# Patient Record
Sex: Female | Born: 1975 | Race: White | Hispanic: No | Marital: Married | Smoking: Current every day smoker
Health system: Southern US, Community
[De-identification: ages and names within clinical notes are randomized; demographics above are authoritative.]

## PROBLEM LIST (undated history)

## (undated) DIAGNOSIS — F419 Anxiety disorder, unspecified: Secondary | ICD-10-CM

## (undated) DIAGNOSIS — K219 Gastro-esophageal reflux disease without esophagitis: Secondary | ICD-10-CM

## (undated) DIAGNOSIS — F32A Depression, unspecified: Secondary | ICD-10-CM

## (undated) DIAGNOSIS — M1990 Unspecified osteoarthritis, unspecified site: Secondary | ICD-10-CM

## (undated) HISTORY — DX: Anxiety disorder, unspecified: F41.9

## (undated) HISTORY — DX: Unspecified osteoarthritis, unspecified site: M19.90

## (undated) HISTORY — PX: WISDOM TOOTH EXTRACTION: SHX21

## (undated) HISTORY — DX: Gastro-esophageal reflux disease without esophagitis: K21.9

## (undated) HISTORY — DX: Depression, unspecified: F32.A

---

## 2013-12-23 ENCOUNTER — Emergency Department: Payer: Self-pay | Admitting: Emergency Medicine

## 2014-06-19 ENCOUNTER — Emergency Department: Payer: Self-pay | Admitting: Emergency Medicine

## 2017-02-16 ENCOUNTER — Emergency Department: Payer: Self-pay

## 2017-02-16 ENCOUNTER — Emergency Department
Admission: EM | Admit: 2017-02-16 | Discharge: 2017-02-16 | Disposition: A | Discharge status: Home / Self Care | Payer: Self-pay | Attending: Emergency Medicine | Admitting: Emergency Medicine

## 2017-02-16 ENCOUNTER — Encounter: Payer: Self-pay | Admitting: Emergency Medicine

## 2017-02-16 ENCOUNTER — Telehealth: Payer: Self-pay | Admitting: Family

## 2017-02-16 DIAGNOSIS — F17200 Nicotine dependence, unspecified, uncomplicated: Secondary | ICD-10-CM | POA: Insufficient documentation

## 2017-02-16 DIAGNOSIS — R079 Chest pain, unspecified: Secondary | ICD-10-CM

## 2017-02-16 DIAGNOSIS — R1010 Upper abdominal pain, unspecified: Secondary | ICD-10-CM

## 2017-02-16 DIAGNOSIS — R1011 Right upper quadrant pain: Secondary | ICD-10-CM | POA: Insufficient documentation

## 2017-02-16 LAB — BASIC METABOLIC PANEL
ANION GAP: 7 (ref 5–15)
BUN: 16 mg/dL (ref 6–20)
CALCIUM: 9.2 mg/dL (ref 8.9–10.3)
CHLORIDE: 106 mmol/L (ref 101–111)
CO2: 24 mmol/L (ref 22–32)
CREATININE: 0.83 mg/dL (ref 0.44–1.00)
GFR calc Af Amer: >60 mL/min (ref >60)
GFR calc non Af Amer: >60 mL/min (ref >60)
Glucose, Bld: 94 mg/dL (ref 65–99)
Potassium: 3.5 mmol/L (ref 3.5–5.1)
SODIUM: 137 mmol/L (ref 135–145)

## 2017-02-16 LAB — CBC
HCT: 40.8 % (ref 35.0–47.0)
Hemoglobin: 14.2 g/dL (ref 12.0–16.0)
MCH: 33 pg (ref 26.0–34.0)
MCHC: 34.8 g/dL (ref 32.0–36.0)
MCV: 95 fL (ref 80.0–100.0)
PLATELETS: 281 10*3/uL (ref 150–440)
RBC: 4.3 MIL/uL (ref 3.80–5.20)
RDW: 13.3 % (ref 11.5–14.5)
WBC: 13.6 10*3/uL — AB (ref 3.6–11.0)

## 2017-02-16 LAB — HEPATIC FUNCTION PANEL
ALBUMIN: 4.3 g/dL (ref 3.5–5.0)
ALT: 27 U/L (ref 14–54)
AST: 21 U/L (ref 15–41)
Alkaline Phosphatase: 75 U/L (ref 38–126)
Bilirubin, Direct: <0.1 mg/dL — ABNORMAL LOW (ref 0.1–0.5)
Total Bilirubin: 0.7 mg/dL (ref 0.3–1.2)
Total Protein: 7.1 g/dL (ref 6.5–8.1)

## 2017-02-16 LAB — TROPONIN I

## 2017-02-16 LAB — LIPASE, BLOOD: Lipase: 30 U/L (ref 11–51)

## 2017-02-16 MED ORDER — PANTOPRAZOLE SODIUM 40 MG PO TBEC: 40.0000 mg | DELAYED_RELEASE_TABLET | Freq: Every day | ORAL | 1 refills | Status: DC

## 2017-02-16 MED ORDER — GI COCKTAIL ~~LOC~~
ORAL | Status: AC
  Administered 2017-02-16: 30 mL via ORAL
  Filled 2017-02-16: qty 30

## 2017-02-16 MED ORDER — GI COCKTAIL ~~LOC~~
30.0000 mL | Freq: Once | ORAL | Status: AC
  Administered 2017-02-16: 30 mL via ORAL

## 2017-02-16 NOTE — ED Triage Notes (Signed)
Pt ambulatory to triage with steady gait, no distress noted. Pt c/o central chest pain that radiates into the right side, no accompanied symptoms of N/V, SOB. Pt reports chest pain started this AM and has been intermittent throughout the day without relief.

## 2017-02-16 NOTE — ED Notes (Signed)
Pt returned from US

## 2017-02-16 NOTE — Progress Notes (Signed)
Based on what you shared with me it looks like you have a serious condition that should be evaluated in a face to face office visit.  NOTE: Even if you have entered your credit card information for this eVisit, you will not be charged.   If you are having a true medical emergency please call 911.  If you need an urgent face to face visit, [facility] has four urgent care centers for your convenience.  If you need care fast and have a high deductible or no insurance consider:   [email or web address]  [phone]  [address], [address] [place], [place] [address] 8 am to 8 pm Monday-Friday 10 am to 4 pm Saturday-Sunday   The following sites will take your  insurance:    . [facility]  [phone] Get Driving Directions Find a Provider at this Location  [address] [place], [place] [address] . 10 am to 8 pm Monday-Friday . 12 pm to 8 pm Saturday-Sunday   . [facility]  [phone] Get Driving Directions Find a Provider at this Location  [address], [address] [place], [place] [address] . 8 am to 8 pm Monday-Friday . 9 am to 6 pm Saturday . 11 am to 6 pm Sunday   .  [facility] [place]  [phone] Get Driving Directions  [address] [place], [place] [address] . 8 am to 8 pm Monday-Friday . 8 am to 4 pm Saturday-Sunday   Your e-visit answers were reviewed by a board certified advanced clinical practitioner to complete your personal care plan.  Thank you for using e-Visits.  

## 2017-02-16 NOTE — Discharge Instructions (Signed)
As we discussed please take your Protonix every morning as prescribed. Please use over-the-counter liquid Maaloxup to 3 times daily for abdominal discomfort. If your abdominal pain fails to resolve within the next 3-4 days please call the number provided for GI medicine to arrange a follow-up appointment as soon as possible for further evaluation. Return to the emergency department for any worsening pain, fever, or any other symptom personally concerning to yourself.

## 2017-02-16 NOTE — ED Notes (Signed)
Called lab to check on add-on labs. They stated they know about them but haven't started running the tests yet.

## 2017-02-16 NOTE — ED Provider Notes (Signed)
[facility] Emergency Department Provider Note  Time seen: 9:47 PM  I have reviewed the triage vital signs and the nursing notes.   HISTORY  Chief Complaint Chest Pain    HPI Samantha Williams is a 41 y.o. female with no past medical history who presents the emergency department for right upper quadrant epigastric abdominal pain. According to the patient beginning this morning she developed epigastric and right upper quadrant abdominal pain. Patient states minimal nausea, no vomiting, no diarrhea, no dysuria or hematuria. No history of prior pain or abdominal surgeries. Describes her pain as moderate aching pain in the epigastrium radiating to the right upper quadrant.  History reviewed. No pertinent past medical history.  There are no active problems to display for this patient.   History reviewed. No pertinent surgical history.  Prior to Admission medications   Not on File    No Known Allergies  History reviewed. No pertinent family history.  Social History Social History  Substance Use Topics  . Smoking status: Current Every Day Smoker  . Smokeless tobacco: Never Used  . Alcohol use No    Review of Systems Constitutional: Negative for fever. Cardiovascular: Negative for chest pain. Respiratory: Negative for shortness of breath. Gastrointestinal: Right upper quadrant/epigastric abdominal pain. Negative for vomiting or diarrhea Genitourinary: Negative for dysuria. Musculoskeletal: Negative for back pain. Skin: Negative for rash. Neurological: Negative for headache All other ROS negative  ____________________________________________   PHYSICAL EXAM:  VITAL SIGNS: ED Triage Vitals  Enc Vitals Group     BP 02/16/17 2122 126/81     Pulse Rate 02/16/17 2122 87     Resp 02/16/17 2122 16     Temp 02/16/17 2122 98 F (36.7 C)     Temp Source 02/16/17 2122 Oral     SpO2 02/16/17 2122 100 %     Weight 02/16/17 2119 130 lb (59 kg)   Height 02/16/17 2119 5\' 1"  (1.549 m)     Head Circumference --      Peak Flow --      Pain Score --      Pain Loc --      Pain Edu? --      Excl. in GC? --     Constitutional: Alert and oriented. Well appearing and in no distress. Eyes: Normal exam ENT   Head: Normocephalic and atraumatic.   Mouth/Throat: Mucous membranes are moist. Cardiovascular: Normal rate, regular rhythm. No murmur Respiratory: Normal respiratory effort without tachypnea nor retractions. Breath sounds are clear  Gastrointestinal: Soft, moderate epigastric to right upper quadrant tenderness. No rebound or guarding. No distention. Musculoskeletal: Nontender with normal range of motion in all extremities Neurologic:  Normal speech and language. No gross focal neurologic deficits  Skin:  Skin is warm, dry and intact.  Psychiatric: Mood and affect are normal.  ____________________________________________    EKG  EKG reviewed and interpreted by myself shows normal sinus rhythm at 86 bpm, narrow QRS, normal axis, normal intervals, no ST changes. Normal EKG  ____________________________________________    RADIOLOGY  Ultrasound negative.  ____________________________________________   INITIAL IMPRESSION / ASSESSMENT AND PLAN / ED COURSE  Pertinent labs & imaging results that were available during my care of the patient were reviewed by me and considered in my medical decision making (see chart for details).  Patient presents the emergency department for upper abdominal discomfort and right upper quadrant pain beginning this morning. Describes her discomfort as moderate. Patient doesn't mild to moderate tenderness palpation epigastrium  and right upper quadrant. We will check labs, ultrasound of the right upper quadrant. Patient does not wish for anything for pain or nausea at this time. We will continue to closely monitor.  Ultrasound gallbladder is negative. Labs are normal including troponin, lipase  and LFTs. Given the patient's epigastric discomfort we will treat with a GI cocktail. We'll discharge with Protonix and over-the-counter Maalox. Patient is agreeable to this plan. ____________________________________________   FINAL CLINICAL IMPRESSION(S) / ED DIAGNOSES  Right upper quadrant pain    Minna Antis, MD 02/16/17 2243

## 2020-02-28 ENCOUNTER — Other Ambulatory Visit: Payer: Self-pay

## 2020-02-28 ENCOUNTER — Encounter: Payer: Self-pay | Admitting: Emergency Medicine

## 2020-02-28 ENCOUNTER — Emergency Department
Admission: EM | Admit: 2020-02-28 | Discharge: 2020-02-28 | Disposition: A | Discharge status: Home / Self Care | Payer: 59 | Attending: Emergency Medicine | Admitting: Emergency Medicine

## 2020-02-28 DIAGNOSIS — R197 Diarrhea, unspecified: Secondary | ICD-10-CM | POA: Diagnosis not present

## 2020-02-28 DIAGNOSIS — K219 Gastro-esophageal reflux disease without esophagitis: Secondary | ICD-10-CM

## 2020-02-28 DIAGNOSIS — R1084 Generalized abdominal pain: Secondary | ICD-10-CM | POA: Diagnosis present

## 2020-02-28 LAB — URINALYSIS, COMPLETE (UACMP) WITH MICROSCOPIC
Bacteria, UA: NONE SEEN
Bilirubin Urine: NEGATIVE
Glucose, UA: NEGATIVE mg/dL
Ketones, ur: NEGATIVE mg/dL
Leukocytes,Ua: NEGATIVE
Nitrite: NEGATIVE
Protein, ur: NEGATIVE mg/dL
Specific Gravity, Urine: 1.009 (ref 1.005–1.030)
pH: 6 (ref 5.0–8.0)

## 2020-02-28 LAB — COMPREHENSIVE METABOLIC PANEL
ALT: 17 U/L (ref 0–44)
AST: 16 U/L (ref 15–41)
Albumin: 4.1 g/dL (ref 3.5–5.0)
Alkaline Phosphatase: 88 U/L (ref 38–126)
Anion gap: 8 (ref 5–15)
BUN: 20 mg/dL (ref 6–20)
CO2: 24 mmol/L (ref 22–32)
Calcium: 9 mg/dL (ref 8.9–10.3)
Chloride: 106 mmol/L (ref 98–111)
Creatinine, Ser: 0.84 mg/dL (ref 0.44–1.00)
GFR calc Af Amer: >60 mL/min (ref >60)
GFR calc non Af Amer: >60 mL/min (ref >60)
Glucose, Bld: 93 mg/dL (ref 70–99)
Potassium: 3.9 mmol/L (ref 3.5–5.1)
Sodium: 138 mmol/L (ref 135–145)
Total Bilirubin: 0.6 mg/dL (ref 0.3–1.2)
Total Protein: 6.9 g/dL (ref 6.5–8.1)

## 2020-02-28 LAB — CBC
HCT: 39.9 % (ref 36.0–46.0)
Hemoglobin: 14.6 g/dL (ref 12.0–15.0)
MCH: 33.3 pg (ref 26.0–34.0)
MCHC: 36.6 g/dL — ABNORMAL HIGH (ref 30.0–36.0)
MCV: 91.1 fL (ref 80.0–100.0)
Platelets: 265 10*3/uL (ref 150–400)
RBC: 4.38 MIL/uL (ref 3.87–5.11)
RDW: 12.6 % (ref 11.5–15.5)
WBC: 11.4 10*3/uL — ABNORMAL HIGH (ref 4.0–10.5)
nRBC: 0 % (ref 0.0–0.2)

## 2020-02-28 LAB — LIPASE, BLOOD: Lipase: 36 U/L (ref 11–51)

## 2020-02-28 MED ORDER — DICYCLOMINE HCL 20 MG PO TABS: 20.0000 mg | ORAL_TABLET | Freq: Three times a day (TID) | ORAL | 0 refills | Status: DC | PRN

## 2020-02-28 MED ORDER — DICYCLOMINE HCL 20 MG PO TABS
20.0000 mg | ORAL_TABLET | Freq: Once | ORAL | Status: AC
  Administered 2020-02-28: 21:00:00 20 mg via ORAL
  Filled 2020-02-28: qty 1

## 2020-02-28 MED ORDER — PANTOPRAZOLE SODIUM 40 MG PO TBEC
40.0000 mg | DELAYED_RELEASE_TABLET | Freq: Once | ORAL | Status: AC
  Administered 2020-02-28: 40 mg via ORAL
  Filled 2020-02-28: qty 1

## 2020-02-28 MED ORDER — PANTOPRAZOLE SODIUM 40 MG PO TBEC: 40.0000 mg | DELAYED_RELEASE_TABLET | Freq: Every day | ORAL | 1 refills | Status: DC

## 2020-02-28 NOTE — ED Provider Notes (Signed)
ER Provider Note       Time seen: 9:11 PM    I have reviewed the vital signs and the nursing notes.  HISTORY   Chief Complaint Abdominal Pain    HPI Samantha Williams is a 44 y.o. female with no known past medical history who presents today for generalized abdominal pain with bloating and diarrhea for the past several days.  Patient denies any vomiting.  Patient states this is happened on and off in her life, was seen several years ago for this and was placed on Protonix.  She does take Tums for GI upset.  She is wondering if she has IBS.  Patient did not have any diarrhea today.  History reviewed. No pertinent past medical history.  History reviewed. No pertinent surgical history.  Allergies Patient has no known allergies.  Review of Systems Constitutional: Negative for fever. Cardiovascular: Negative for chest pain. Respiratory: Negative for shortness of breath. Gastrointestinal: Positive for abdominal pain, bloating and diarrhea. Musculoskeletal: Negative for back pain. Skin: Negative for rash. Neurological: Negative for headaches, focal weakness or numbness.  All systems negative/normal/unremarkable except as stated in the HPI  ____________________________________________   PHYSICAL EXAM:  VITAL SIGNS: Vitals:   02/28/20 1726  BP: 115/86  Pulse: 84  Resp: 16  Temp: 98.2 F (36.8 C)  SpO2: 100%    Constitutional: Alert and oriented. Well appearing and in no distress. Eyes: Conjunctivae are normal. Normal extraocular movements. ENT      Head: Normocephalic and atraumatic.      Nose: No congestion/rhinnorhea.      Mouth/Throat: Mucous membranes are moist.      Neck: No stridor. Cardiovascular: Normal rate, regular rhythm. No murmurs, rubs, or gallops. Respiratory: Normal respiratory effort without tachypnea nor retractions. Breath sounds are clear and equal bilaterally. No wheezes/rales/rhonchi. Gastrointestinal: Mild epigastric tenderness, no rebound  or guarding.  Normal bowel sounds. Musculoskeletal: Nontender with normal range of motion in extremities. No lower extremity tenderness nor edema. Neurologic:  Normal speech and language. No gross focal neurologic deficits are appreciated.  Skin:  Skin is warm, dry and intact. No rash noted. Psychiatric: Speech and behavior are normal.  ____________________________________________   LABS (pertinent positives/negatives)  Labs Reviewed  CBC - Abnormal; Notable for the following components:      Result Value   WBC 11.4 (*)    MCHC 36.6 (*)    All other components within normal limits  URINALYSIS, COMPLETE (UACMP) WITH MICROSCOPIC - Abnormal; Notable for the following components:   Color, Urine STRAW (*)    APPearance CLEAR (*)    Hgb urine dipstick MODERATE (*)    All other components within normal limits  LIPASE, BLOOD  COMPREHENSIVE METABOLIC PANEL  POC URINE PREG, ED    DIFFERENTIAL DIAGNOSIS  GERD, gastritis, peptic ulcer disease, IBS  ASSESSMENT AND PLAN  GERD, possible IBS   Plan: The patient had presented for abdominal discomfort with diarrhea and bloating. Patient's labs were overall reassuring with only mild leukocytosis.  Her exam is benign.  I do suspect she may have IBS and will give her a diet and information about same.  She will be placed on Protonix and Bentyl and will be referred to GI if not improving.Daryel November MD    Note: This note was generated in part or whole with voice recognition software. Voice recognition is usually quite accurate but there are transcription errors that can and very often do occur. I apologize for any typographical errors  that were not detected and corrected.     Emily Filbert, MD 02/28/20 2117

## 2020-02-28 NOTE — ED Triage Notes (Signed)
Pt in via POV, reports generalized abdominal pain, bloat, diarrhea x a few days, denies any vomiting.  Ambulatory to triage, NAD noted at this time.

## 2020-03-02 ENCOUNTER — Emergency Department
Admission: EM | Admit: 2020-03-02 | Discharge: 2020-03-02 | Disposition: A | Discharge status: Home / Self Care | Payer: 59 | Attending: Emergency Medicine | Admitting: Emergency Medicine

## 2020-03-02 ENCOUNTER — Other Ambulatory Visit: Payer: Self-pay

## 2020-03-02 ENCOUNTER — Emergency Department: Payer: 59

## 2020-03-02 DIAGNOSIS — R1032 Left lower quadrant pain: Secondary | ICD-10-CM | POA: Diagnosis present

## 2020-03-02 DIAGNOSIS — R110 Nausea: Secondary | ICD-10-CM | POA: Insufficient documentation

## 2020-03-02 DIAGNOSIS — F17210 Nicotine dependence, cigarettes, uncomplicated: Secondary | ICD-10-CM | POA: Insufficient documentation

## 2020-03-02 DIAGNOSIS — R109 Unspecified abdominal pain: Secondary | ICD-10-CM

## 2020-03-02 DIAGNOSIS — Z79899 Other long term (current) drug therapy: Secondary | ICD-10-CM | POA: Diagnosis not present

## 2020-03-02 DIAGNOSIS — R197 Diarrhea, unspecified: Secondary | ICD-10-CM | POA: Insufficient documentation

## 2020-03-02 LAB — URINALYSIS, COMPLETE (UACMP) WITH MICROSCOPIC
Bilirubin Urine: NEGATIVE
Glucose, UA: NEGATIVE mg/dL
Ketones, ur: NEGATIVE mg/dL
Leukocytes,Ua: NEGATIVE
Nitrite: NEGATIVE
Protein, ur: NEGATIVE mg/dL
Specific Gravity, Urine: 1.023 (ref 1.005–1.030)
pH: 5 (ref 5.0–8.0)

## 2020-03-02 LAB — COMPREHENSIVE METABOLIC PANEL
ALT: 15 U/L (ref 0–44)
AST: 18 U/L (ref 15–41)
Albumin: 4.7 g/dL (ref 3.5–5.0)
Alkaline Phosphatase: 88 U/L (ref 38–126)
Anion gap: 7 (ref 5–15)
BUN: 13 mg/dL (ref 6–20)
CO2: 25 mmol/L (ref 22–32)
Calcium: 9.2 mg/dL (ref 8.9–10.3)
Chloride: 107 mmol/L (ref 98–111)
Creatinine, Ser: 0.87 mg/dL (ref 0.44–1.00)
GFR calc Af Amer: >60 mL/min (ref >60)
GFR calc non Af Amer: >60 mL/min (ref >60)
Glucose, Bld: 95 mg/dL (ref 70–99)
Potassium: 3.8 mmol/L (ref 3.5–5.1)
Sodium: 139 mmol/L (ref 135–145)
Total Bilirubin: 0.9 mg/dL (ref 0.3–1.2)
Total Protein: 7.5 g/dL (ref 6.5–8.1)

## 2020-03-02 LAB — CBC
HCT: 42.9 % (ref 36.0–46.0)
Hemoglobin: 15.2 g/dL — ABNORMAL HIGH (ref 12.0–15.0)
MCH: 32.5 pg (ref 26.0–34.0)
MCHC: 35.4 g/dL (ref 30.0–36.0)
MCV: 91.7 fL (ref 80.0–100.0)
Platelets: 274 10*3/uL (ref 150–400)
RBC: 4.68 MIL/uL (ref 3.87–5.11)
RDW: 12.3 % (ref 11.5–15.5)
WBC: 9.7 10*3/uL (ref 4.0–10.5)
nRBC: 0 % (ref 0.0–0.2)

## 2020-03-02 LAB — LIPASE, BLOOD: Lipase: 34 U/L (ref 11–51)

## 2020-03-02 LAB — PREGNANCY, URINE: Preg Test, Ur: NEGATIVE

## 2020-03-02 MED ORDER — SODIUM CHLORIDE 0.9 % IV BOLUS
1000.0000 mL | Freq: Once | INTRAVENOUS | Status: AC
  Administered 2020-03-02: 1000 mL via INTRAVENOUS

## 2020-03-02 MED ORDER — ACETAMINOPHEN 325 MG PO TABS
650.0000 mg | ORAL_TABLET | Freq: Once | ORAL | Status: AC
  Administered 2020-03-02: 650 mg via ORAL
  Filled 2020-03-02: qty 2

## 2020-03-02 MED ORDER — ONDANSETRON HCL 4 MG/2ML IJ SOLN
4.0000 mg | Freq: Once | INTRAMUSCULAR | Status: AC
  Administered 2020-03-02: 4 mg via INTRAVENOUS
  Filled 2020-03-02: qty 2

## 2020-03-02 MED ORDER — IOHEXOL 300 MG/ML  SOLN
100.0000 mL | Freq: Once | INTRAMUSCULAR | Status: AC | PRN
  Administered 2020-03-02: 100 mL via INTRAVENOUS

## 2020-03-02 MED ORDER — SODIUM CHLORIDE 0.9% FLUSH
3.0000 mL | Freq: Once | INTRAVENOUS | Status: AC
  Administered 2020-03-02: 3 mL via INTRAVENOUS

## 2020-03-02 MED ORDER — KETOROLAC TROMETHAMINE 30 MG/ML IJ SOLN
15.0000 mg | Freq: Once | INTRAMUSCULAR | Status: AC
  Administered 2020-03-02: 15 mg via INTRAVENOUS
  Filled 2020-03-02: qty 1

## 2020-03-02 NOTE — ED Provider Notes (Signed)
[facility] Emergency Department Provider Note  ____________________________________________   First MD Initiated Contact with Patient 03/02/20 1715     (approximate)  I have reviewed the triage vital signs and the nursing notes.   HISTORY  Chief Complaint Abdominal Pain    HPI Samantha Williams is a 44 y.o. female who comes in with upper abdominal pain with watery diarrhea since Saturday.  Patient states that she was here 3 days ago for the same is not feeling any better.  Patient states that she is been taking the Protonix and Bentyl without any improvement.  She states that now the pain is shooting down to her left lower quadrant, moderate, constant, intermittent, worse after eating, nothing makes it better.  She does not really have any pain on the right side of her abdomen.  Seems to be more the left side.  Denies having this previously.  States that the diarrhea mostly occurs after eating.  A little bit of nausea but no vomiting.  Denies any chest pain, shortness of breath.  Last menstruation was 2 months ago.  On review of records she had reassuring abdominal exam and it was thought it was more likely IBS and they talked about diet and she is placed on Protonix and Bentyl and referred to GI.   History reviewed. No pertinent past medical history.  There are no problems to display for this patient.   History reviewed. No pertinent surgical history.  Prior to Admission medications   Medication Sig Start Date End Date Taking? Authorizing Provider  dicyclomine (BENTYL) 20 MG tablet Take 1 tablet (20 mg total) by mouth 3 (three) times daily as needed for spasms. 02/28/20   Emily Filbert, MD  pantoprazole (PROTONIX) 40 MG tablet Take 1 tablet (40 mg total) by mouth daily. 02/16/17 02/16/18  Minna Antis, MD  pantoprazole (PROTONIX) 40 MG tablet Take 1 tablet (40 mg total) by mouth daily. 02/28/20 02/27/21  Emily Filbert, MD     Allergies Patient has no known allergies.  No family history on file.  Social History Social History   Tobacco Use  . Smoking status: Current Every Day Smoker    Packs/day: 0.50    Types: Cigarettes  . Smokeless tobacco: Never Used  Vaping Use  . Vaping Use: Never used  Substance Use Topics  . Alcohol use: No  . Drug use: No      Review of Systems Constitutional: No fever/chills Eyes: No visual changes. ENT: No sore throat. Cardiovascular: Denies chest pain. Respiratory: Denies shortness of breath. Gastrointestinal: Positive abdominal pain, diarrhea, nausea Genitourinary: Negative for dysuria. Musculoskeletal: Negative for back pain. Skin: Negative for rash. Neurological: Negative for headaches, focal weakness or numbness. All other ROS negative ____________________________________________   PHYSICAL EXAM:  VITAL SIGNS: ED Triage Vitals  Enc Vitals Group     BP 03/02/20 1303 121/81     Pulse Rate 03/02/20 1303 79     Resp 03/02/20 1303 18     Temp 03/02/20 1303 98 F (36.7 C)     Temp Source 03/02/20 1303 Oral     SpO2 03/02/20 1303 95 %     Weight 03/02/20 1308 130 lb (59 kg)     Height 03/02/20 1308 5' (1.524 m)     Head Circumference --      Peak Flow --      Pain Score 03/02/20 1308 3     Pain Loc --      Pain Edu? --  Excl. in GC? --     Constitutional: Alert and oriented. Well appearing and in no acute distress. Eyes: Conjunctivae are normal. EOMI. Head: Atraumatic. Nose: No congestion/rhinnorhea. Mouth/Throat: Mucous membranes are moist.   Neck: No stridor. Trachea Midline. FROM Cardiovascular: Normal rate, regular rhythm. Grossly normal heart sounds.  Good peripheral circulation. Respiratory: Normal respiratory effort.  No retractions. Lungs CTAB. Gastrointestinal: Soft but some tenderness in the left lower quadrant no distention. No abdominal bruits.  Musculoskeletal: No lower extremity tenderness nor edema.  No joint  effusions. Neurologic:  Normal speech and language. No gross focal neurologic deficits are appreciated.  Skin:  Skin is warm, dry and intact. No rash noted. Psychiatric: Mood and affect are normal. Speech and behavior are normal. GU: Deferred   ____________________________________________   LABS (all labs ordered are listed, but only abnormal results are displayed)  Labs Reviewed  CBC - Abnormal; Notable for the following components:      Result Value   Hemoglobin 15.2 (*)    All other components within normal limits  URINALYSIS, COMPLETE (UACMP) WITH MICROSCOPIC - Abnormal; Notable for the following components:   Color, Urine YELLOW (*)    APPearance HAZY (*)    Hgb urine dipstick SMALL (*)    Bacteria, UA RARE (*)    All other components within normal limits  LIPASE, BLOOD  COMPREHENSIVE METABOLIC PANEL  POC URINE PREG, ED   ____________________________________________   RADIOLOGY   Official radiology report(s): CT ABDOMEN PELVIS W CONTRAST  Result Date: 03/02/2020 CLINICAL DATA:  Upper abdominal pain with watery diarrhea. EXAM: CT ABDOMEN AND PELVIS WITH CONTRAST TECHNIQUE: Multidetector CT imaging of the abdomen and pelvis was performed using the standard protocol following bolus administration of intravenous contrast. CONTRAST:  OMNIPAQUE IOHEXOL 300 MG/ML  SOLN COMPARISON:  None. FINDINGS: Lower chest: No acute abnormality. Hepatobiliary: No focal liver abnormality is seen. No gallstones, gallbladder wall thickening, or biliary dilatation. Pancreas: Unremarkable. No pancreatic ductal dilatation or surrounding inflammatory changes. Spleen: Normal in size without focal abnormality. Adrenals/Urinary Tract: Adrenal glands are unremarkable. Kidneys are normal, without renal calculi, focal lesion, or hydronephrosis. Bladder is unremarkable. Stomach/Bowel: Stomach is within normal limits. Appendix appears normal. No evidence of bowel wall thickening, distention, or inflammatory  changes. Vascular/Lymphatic: No significant vascular findings are present. No enlarged abdominal or pelvic lymph nodes. Reproductive: Uterus and bilateral adnexa are unremarkable. Other: No abdominal wall hernia or abnormality. No abdominopelvic ascites. Musculoskeletal: Moderate severity degenerative changes are seen at the level of L5-S1. IMPRESSION: No acute intra-abdominal findings. Electronically Signed   By: Aram Candela M.D.   On: 03/02/2020 19:27    ____________________________________________   PROCEDURES  Procedure(s) performed (including Critical Care):  Procedures   ____________________________________________   INITIAL IMPRESSION / ASSESSMENT AND PLAN / ED COURSE  Samantha Williams was evaluated in Emergency Department on 03/02/2020 for the symptoms described in the history of present illness. She was evaluated in the context of the global COVID-19 pandemic, which necessitated consideration that the patient might be at risk for infection with the SARS-CoV-2 virus that causes COVID-19. Institutional protocols and algorithms that pertain to the evaluation of patients at risk for COVID-19 are in a state of rapid change based on information released by regulatory bodies including the CDC and federal and state organizations. These policies and algorithms were followed during the patient's care in the ED.    Patient is a well-appearing 44 year old who comes in for continued abdominal pain nausea and diarrhea.  Given patient's  pain is now more located left lower quadrant will get CT scan to make sure no evidence of diverticulitis, abscess, perforation.  Denies any recent surgeries to suggest SBO.  Denies any symptoms of UTI.  Does not only have right upper quadrant tenderness to suggest gallbladder pathology.  Will treat patient's symptoms with some Toradol, Tylenol, fluid, Zofran.  Labs are reassuring.  No lipase elevation or LFT elevation White count is normal making infection less  likely UA without evidence of UTI  CT scan is negative.  Patient states that he has a GI follow-up on Tuesday.  She is unable to give a stool sample but denies any risk factors for C. difficile.  I suspect that this is most likely irritable bowel.  She declines nausea medicine.  Patient will be discharged home with follow-up with GI      ____________________________________________   FINAL CLINICAL IMPRESSION(S) / ED DIAGNOSES   Final diagnoses:  Abdominal pain, unspecified abdominal location      MEDICATIONS GIVEN DURING THIS VISIT:  Medications  sodium chloride flush (NS) 0.9 % injection 3 mL (3 mLs Intravenous Given 03/02/20 1806)  ketorolac (TORADOL) 30 MG/ML injection 15 mg (15 mg Intravenous Given 03/02/20 1807)  acetaminophen (TYLENOL) tablet 650 mg (650 mg Oral Given 03/02/20 1806)  ondansetron (ZOFRAN) injection 4 mg (4 mg Intravenous Given 03/02/20 1806)  sodium chloride 0.9 % bolus 1,000 mL (1,000 mLs Intravenous New Bag/Given 03/02/20 1809)  iohexol (OMNIPAQUE) 300 MG/ML solution 100 mL (100 mLs Intravenous Contrast Given 03/02/20 1915)     ED Discharge Orders    None       Note:  This document was prepared using Dragon voice recognition software and may include unintentional dictation errors.   Concha Se, MD 03/02/20 2020

## 2020-03-02 NOTE — ED Notes (Addendum)
Pt up to bedside toilet per pt's request. Will complete orders once pt done. Pt made aware sample needed when possible. Hat in toilet. Pt educated.

## 2020-03-02 NOTE — Discharge Instructions (Addendum)
CT scan was negative.  Labs are reassuring.  Follow-up with the GI team.  Continue take the Bentyl.  Return to the ER for vomiting, fevers or any other concerns

## 2020-03-02 NOTE — ED Triage Notes (Signed)
Pt c/o upper abd pain with watery diarrhea since Saturday, was seen here 3 days ago for the same and states she is not getting any better.

## 2020-03-02 NOTE — ED Notes (Signed)
Pt has another 300cc left of NS bolus to go. Resting calmly in bed. Bed locked low. Rail up. Call bell within reach. Deny any needs currently.

## 2020-03-02 NOTE — ED Notes (Signed)
Pt updated in WR, VS reassessed °

## 2020-03-02 NOTE — ED Notes (Signed)
Pt leaving for imaging.

## 2020-03-02 NOTE — ED Notes (Signed)
Pt c/o diarrhea, nausea at times, cramping after eating in [facility]; denies fever, cough, CP, SOB. Pt resting calmly in bed. Skin dry. Resp reg/unlabored.

## 2020-03-02 NOTE — ED Notes (Signed)
Pt given blanket. Denies any other needs. Bed locked low. Rail up. Call bell within reach.

## 2020-03-06 ENCOUNTER — Ambulatory Visit: Payer: 59 | Admitting: Gastroenterology

## 2020-03-12 ENCOUNTER — Other Ambulatory Visit: Payer: Self-pay

## 2020-03-12 ENCOUNTER — Ambulatory Visit (INDEPENDENT_AMBULATORY_CARE_PROVIDER_SITE_OTHER): Payer: 59 | Admitting: Gastroenterology

## 2020-03-12 VITALS — BP 129/74 | HR 68 | Temp 98.1°F | Ht 60.0 in | Wt 135.0 lb

## 2020-03-12 DIAGNOSIS — R197 Diarrhea, unspecified: Secondary | ICD-10-CM

## 2020-03-12 DIAGNOSIS — Z791 Long term (current) use of non-steroidal anti-inflammatories (NSAID): Secondary | ICD-10-CM

## 2020-03-12 NOTE — Progress Notes (Signed)
Samantha Mood MD, MRCP(U.K) [address]  [address]  [place], [place] [address]  Main: [phone]  Fax: [phone]   Gastroenterology Consultation  Referring Provider:     Emergency room Primary Care Physician:  Patient, No Pcp Per Primary Gastroenterologist:  Dr. Wyline Williams  Reason for Consultation:   Abdominal pain and diarrhea        HPI:   Samantha Williams is a 44 y.o. y/o female presented to the emergency room on 02/28/2020 and 03/02/2020.  On 02/28/2020 presented to the emergency room with abdominal pain, bloating and diarrhea of several days duration.  There was no vomiting.  On the day of the ER visit did not have any diarrhea.  Commenced on Protonix and Bentyl and plan was to see GI if not responding.  The patient returned on 03/02/2020 to the emergency room with upper abdominal pain and diarrhea.  Patient Protonix and Bentyl did not help.  She underwent a CT scan of the abdomen and pelvis that demonstrated no acute findings.  She underwent blood work with a negative urine pregnancy test.  Urine analysis was positive for small blood in the urine.  Hemoglobin was 15.2 g with an MCV of 91.7.  CMP was normal, lipase was normal. At that point of time she was unable to give a stool sample.  Refer to GI at that point of time. She states that presently she has up to 5 bowel movements per day watery in nature nonbloody.  Associated with cramping.  No joint pains and no skin rashes.  No prior colonoscopy.  She is a smoker.  Used to suffer from constipation.  She does have abdominal discomfort which at times is relieved with a bowel movement.  No family history of colon cancer or polyps.  She takes ibuprofen at least 5 days out of 10 for back pain.  She has been taking it for more than a few weeks at this point of time.  No past medical history on file.  No past surgical history on file.  Prior to Admission medications   Medication Sig Start Date End Date Taking? Authorizing Provider    dicyclomine (BENTYL) 20 MG tablet Take 1 tablet (20 mg total) by mouth 3 (three) times daily as needed for spasms. 02/28/20   Emily Filbert, MD  pantoprazole (PROTONIX) 40 MG tablet Take 1 tablet (40 mg total) by mouth daily. 02/16/17 02/16/18  Minna Antis, MD  pantoprazole (PROTONIX) 40 MG tablet Take 1 tablet (40 mg total) by mouth daily. 02/28/20 02/27/21  Emily Filbert, MD    No family history on file.   Social History   Tobacco Use  . Smoking status: Current Every Day Smoker    Packs/day: 0.50    Types: Cigarettes  . Smokeless tobacco: Never Used  Vaping Use  . Vaping Use: Never used  Substance Use Topics  . Alcohol use: No  . Drug use: No    Allergies as of 03/12/2020  . (No Known Allergies)    Review of Systems:    All systems reviewed and negative except where noted in HPI.   Physical Exam:  There were no vitals taken for this visit. No LMP recorded. (Menstrual status: Perimenopausal). Psych:  Alert and cooperative. Normal Williams and affect. General:   Alert,  Well-developed, well-nourished, pleasant and cooperative in NAD Head:  Normocephalic and atraumatic. Eyes:  Sclera clear, no icterus.   Conjunctiva pink. Abdomen:  Normal bowel sounds.  No bruits.  Soft, non-tender and non-distended without masses, hepatosplenomegaly or hernias noted.  No guarding or rebound tenderness.    Neurologic:  Alert and oriented x3;  grossly normal neurologically. Psych:  Alert and cooperative. Normal Williams and affect.  Imaging Studies: CT ABDOMEN PELVIS W CONTRAST  Result Date: 03/02/2020 CLINICAL DATA:  Upper abdominal pain with watery diarrhea. EXAM: CT ABDOMEN AND PELVIS WITH CONTRAST TECHNIQUE: Multidetector CT imaging of the abdomen and pelvis was performed using the standard protocol following bolus administration of intravenous contrast. CONTRAST:  OMNIPAQUE IOHEXOL 300 MG/ML  SOLN COMPARISON:  None. FINDINGS: Lower chest: No acute abnormality. Hepatobiliary:  No focal liver abnormality is seen. No gallstones, gallbladder wall thickening, or biliary dilatation. Pancreas: Unremarkable. No pancreatic ductal dilatation or surrounding inflammatory changes. Spleen: Normal in size without focal abnormality. Adrenals/Urinary Tract: Adrenal glands are unremarkable. Kidneys are normal, without renal calculi, focal lesion, or hydronephrosis. Bladder is unremarkable. Stomach/Bowel: Stomach is within normal limits. Appendix appears normal. No evidence of bowel wall thickening, distention, or inflammatory changes. Vascular/Lymphatic: No significant vascular findings are present. No enlarged abdominal or pelvic lymph nodes. Reproductive: Uterus and bilateral adnexa are unremarkable. Other: No abdominal wall hernia or abnormality. No abdominopelvic ascites. Musculoskeletal: Moderate severity degenerative changes are seen at the level of L5-S1. IMPRESSION: No acute intra-abdominal findings. Electronically Signed   By: Aram Candela M.D.   On: 03/02/2020 19:27    Assessment and Plan:   Samantha Williams is a 44 y.o. y/o female has been referred for acute onset abdominal discomfort and diarrhea.  History of NSAID use.  Smoker.  No prior colonoscopy.  Differentials include infectious colitis versus NSAID related colitis versus inflammatory bowel disease.  Plan 1.  H. pylori stool antigen 2.  Stool for C. difficile, GI PCR, fecal calprotectin.  Also check serum CRP. 3.  Can use Imodium as needed. 4.  Avoid all artificial sugars, sweeteners, NSAIDs.  If no better will need an EGD and colonoscopy.  Already on a PPI. 5.  Stop all NSAIDs.  If no better in 2 to 3 weeks will need at least flexible sigmoidoscopy if not colonoscopy.  Follow up in 2 weeks telephone visit  Dr Samantha Mood MD,MRCP(U.K)

## 2020-03-13 LAB — C-REACTIVE PROTEIN: CRP: 2 mg/L (ref 0–10)

## 2020-04-16 ENCOUNTER — Telehealth (INDEPENDENT_AMBULATORY_CARE_PROVIDER_SITE_OTHER): Payer: 59 | Admitting: Gastroenterology

## 2020-04-16 DIAGNOSIS — Z5329 Procedure and treatment not carried out because of patient's decision for other reasons: Secondary | ICD-10-CM

## 2020-04-16 DIAGNOSIS — Z91199 Patient's noncompliance with other medical treatment and regimen due to unspecified reason: Secondary | ICD-10-CM

## 2020-04-16 NOTE — Progress Notes (Signed)
No show couldn't leave vm as mailbox full

## 2020-09-26 ENCOUNTER — Encounter: Payer: Self-pay | Admitting: Advanced Practice Midwife

## 2020-09-26 ENCOUNTER — Ambulatory Visit (INDEPENDENT_AMBULATORY_CARE_PROVIDER_SITE_OTHER): Payer: 59 | Admitting: Advanced Practice Midwife

## 2020-09-26 ENCOUNTER — Other Ambulatory Visit: Payer: Self-pay

## 2020-09-26 VITALS — BP 123/70 | HR 86 | Ht 61.0 in | Wt 137.6 lb

## 2020-09-26 DIAGNOSIS — N898 Other specified noninflammatory disorders of vagina: Secondary | ICD-10-CM

## 2020-09-26 DIAGNOSIS — Z01419 Encounter for gynecological examination (general) (routine) without abnormal findings: Secondary | ICD-10-CM

## 2020-09-26 DIAGNOSIS — N951 Menopausal and female climacteric states: Secondary | ICD-10-CM | POA: Diagnosis not present

## 2020-09-26 NOTE — Progress Notes (Signed)
GYNECOLOGY ANNUAL PREVENTATIVE CARE ENCOUNTER NOTE  History:     Samantha Williams is a 45 y.o. [id number] female here for a routine annual gynecologic exam.  Current complaints: recurrent vasomotor symptoms in the setting of perimenopause. She endorses extreme night sweats. Denies abnormal vaginal bleeding, discharge, pelvic pain, problems with intercourse or other gynecologic concerns.    Patient endorses prolonged amenorrhea "Can't even remember". Long-term relationship with female partner. Non-smoker.   Gynecologic History No LMP recorded. (Menstrual status: Perimenopausal). Contraception: Perimenopausal Last Pap: remote, unsure of date. Results were: normal with negative HPV Last mammogram: Remote (about age 59 due to nipple leaking). Results were: normal  Obstetric History OB History  Gravida Para Term Preterm AB Living  3 2 2   1 2   SAB IAB Ectopic Multiple Live Births  1       2    # Outcome Date GA Lbr Len/2nd Weight Sex Delivery Anes PTL Lv  3 Term 11/20/99    F Vag-Spont None  LIV  2 SAB 1999          1 Term 11/27/96    M Vag-Spont EPI  DEC    History reviewed. No pertinent past medical history.  Past Surgical History:  Procedure Laterality Date  . WISDOM TOOTH EXTRACTION      Current Outpatient Medications on File Prior to Visit  Medication Sig Dispense Refill  . Ascorbic Acid (VITAMIN C) 100 MG tablet Take 100 mg by mouth daily.    . cholecalciferol (VITAMIN D3) 25 MCG (1000 UNIT) tablet Take 1,000 Units by mouth daily.    01/27/97 dicyclomine (BENTYL) 20 MG tablet Take 1 tablet (20 mg total) by mouth 3 (three) times daily as needed for spasms. (Patient not taking: Reported on 09/26/2020) 30 tablet 0  . pantoprazole (PROTONIX) 40 MG tablet Take 1 tablet (40 mg total) by mouth daily. 30 tablet 1  . pantoprazole (PROTONIX) 40 MG tablet Take 1 tablet (40 mg total) by mouth daily. (Patient not taking: Reported on 09/26/2020) 30 tablet 1   No current facility-administered  medications on file prior to visit.    No Known Allergies  Social History:  reports that she has been smoking cigarettes. She has been smoking about 0.50 packs per day. She has never used smokeless tobacco. She reports that she does not drink alcohol and does not use drugs.  Family History  Problem Relation Age of Onset  . Breast cancer Maternal Grandmother   . Cancer Father     The following portions of the patient's history were reviewed and updated as appropriate: allergies, current medications, past family history, past medical history, past social history, past surgical history and problem list.  Review of Systems Pertinent items noted in HPI and remainder of comprehensive ROS otherwise negative.  Physical Exam:  BP 123/70   Pulse 86   Ht 5\' 1"  (1.549 m)   Wt 137 lb 9.6 oz (62.4 kg)   BMI 26.00 kg/m  CONSTITUTIONAL: Well-developed, well-nourished female in no acute distress.  HENT:  Normocephalic, atraumatic, External right and left ear normal.  EYES: Conjunctivae and EOM are normal. Pupils are equal, round, and reactive to light. No scleral icterus.  NECK: Normal range of motion, supple, no masses.  Normal thyroid.  SKIN: Skin is warm and dry. No rash noted. Not diaphoretic. No erythema. No pallor. MUSCULOSKELETAL: Normal range of motion. No tenderness.  No cyanosis, clubbing, or edema.   NEUROLOGIC: Alert and oriented to person, place, and  time. Normal reflexes, muscle tone coordination.  PSYCHIATRIC: Normal mood and affect. Normal behavior. Normal judgment and thought content. CARDIOVASCULAR: Normal heart rate noted, regular rhythm RESPIRATORY: Clear to auscultation bilaterally. Effort and breath sounds normal, no problems with respiration noted. BREASTS: Symmetric in size. No masses, tenderness, skin changes, nipple drainage, or lymphadenopathy bilaterally. Performed in the presence of a chaperone. ABDOMEN: Soft, no distention noted.  No tenderness, rebound or guarding.   PELVIC: Normal appearing external genitalia and urethral meatus; normal appearing vaginal mucosa and cervix.  No abnormal discharge noted.  Pap smear obtained.  Normal uterine size, no other palpable masses, no uterine or adnexal tenderness.  Performed in the presence of a chaperone.   Assessment and Plan:    1. Well woman exam with routine gynecological exam - No acute concerns - MM 3D SCREEN BREAST BILATERAL; Future - IGP, Aptima HPV, rfx 16/18,45  2. Vaginal discharge  - NuSwab VG, Candida 6sp  3. Perimenopausal vasomotor symptoms - Advised environmental changes, natural ebb and flow of symptoms - Recommended follow-up with MD due patient's desire for safe but timely and assertive intervention  Will follow up results of pap smear and manage accordingly. Mammogram scheduled Routine preventative health maintenance measures emphasized. Please refer to After Visit Summary for other counseling recommendations.      Clayton Bibles, MSN, CNM Certified Nurse Midwife, [employer] for [employer], [facility] 09/26/20 8:30 PM

## 2020-09-26 NOTE — Progress Notes (Signed)
[facility]

## 2020-09-26 NOTE — Patient Instructions (Signed)
Preventive Care 44-44 Years Old, Female Preventive care refers to lifestyle choices and visits with your health care provider that can promote health and wellness. This includes:  A yearly physical exam. This is also called an annual wellness visit.  Regular dental and eye exams.  Immunizations.  Screening for certain conditions.  Healthy lifestyle choices, such as: ? Eating a healthy diet. ? Getting regular exercise. ? Not using drugs or products that contain nicotine and tobacco. ? Limiting alcohol use. What can I expect for my preventive care visit? Physical exam Your health care provider will check your:  Height and weight. These may be used to calculate your BMI (body mass index). BMI is a measurement that tells if you are at a healthy weight.  Heart rate and blood pressure.  Body temperature.  Skin for abnormal spots. Counseling Your health care provider may ask you questions about your:  Past medical problems.  Family's medical history.  Alcohol, tobacco, and drug use.  Emotional well-being.  Home life and relationship well-being.  Sexual activity.  Diet, exercise, and sleep habits.  Work and work Statistician.  Access to firearms.  Method of birth control.  Menstrual cycle.  Pregnancy history. What immunizations do I need? Vaccines are usually given at various ages, according to a schedule. Your health care provider will recommend vaccines for you based on your age, medical history, and lifestyle or other factors, such as travel or where you work.   What tests do I need? Blood tests  Lipid and cholesterol levels. These may be checked every 5 years, or more often if you are over 44 years old.  Hepatitis C test.  Hepatitis B test. Screening  Lung cancer screening. You may have this screening every year starting at age 44 if you have a 30-pack-year history of smoking and currently smoke or have quit within the past 15 years.  Colorectal cancer  screening. ? All adults should have this screening starting at age 44 and continuing until age 44. ? Your health care provider may recommend screening at age 44 if you are at increased risk. ? You will have tests every 1-10 years, depending on your results and the type of screening test.  Diabetes screening. ? This is done by checking your blood sugar (glucose) after you have not eaten for a while (fasting). ? You may have this done every 1-3 years.  Mammogram. ? This may be done every 1-2 years. ? Talk with your health care provider about when you should start having regular mammograms. This may depend on whether you have a family history of breast cancer.  BRCA-related cancer screening. This may be done if you have a family history of breast, ovarian, tubal, or peritoneal cancers.  Pelvic exam and Pap test. ? This may be done every 3 years starting at age 44. ? Starting at age 44, this may be done every 5 years if you have a Pap test in combination with an HPV test. Other tests  STD (sexually transmitted disease) testing, if you are at risk.  Bone density scan. This is done to screen for osteoporosis. You may have this scan if you are at high risk for osteoporosis. Talk with your health care provider about your test results, treatment options, and if necessary, the need for more tests. Follow these instructions at home: Eating and drinking  Eat a diet that includes fresh fruits and vegetables, whole grains, lean protein, and low-fat dairy products.  Take vitamin and mineral supplements  as recommended by your health care provider.  Do not drink alcohol if: ? Your health care provider tells you not to drink. ? You are pregnant, may be pregnant, or are planning to become pregnant.  If you drink alcohol: ? Limit how much you have to 0-1 drink a day. ? Be aware of how much alcohol is in your drink. In the [place] one drink equals one 12 oz bottle of beer (355 mL), one 5 oz glass of  wine (148 mL), or one 1 oz glass of hard liquor (44 mL).   Lifestyle  Take daily care of your teeth and gums. Brush your teeth every morning and night with fluoride toothpaste. Floss one time each day.  Stay active. Exercise for at least 30 minutes 5 or more days each week.  Do not use any products that contain nicotine or tobacco, such as cigarettes, e-cigarettes, and chewing tobacco. If you need help quitting, ask your health care provider.  Do not use drugs.  If you are sexually active, practice safe sex. Use a condom or other form of protection to prevent STIs (sexually transmitted infections).  If you do not wish to become pregnant, use a form of birth control. If you plan to become pregnant, see your health care provider for a prepregnancy visit.  If told by your health care provider, take low-dose aspirin daily starting at age 44.  Find healthy ways to cope with stress, such as: ? Meditation, yoga, or listening to music. ? Journaling. ? Talking to a trusted person. ? Spending time with friends and family. Safety  Always wear your seat belt while driving or riding in a vehicle.  Do not drive: ? If you have been drinking alcohol. Do not ride with someone who has been drinking. ? When you are tired or distracted. ? While texting.  Wear a helmet and other protective equipment during sports activities.  If you have firearms in your house, make sure you follow all gun safety procedures. What's next?  Visit your health care provider once a year for an annual wellness visit.  Ask your health care provider how often you should have your eyes and teeth checked.  Stay up to date on all vaccines. This information is not intended to replace advice given to you by your health care provider. Make sure you discuss any questions you have with your health care provider. Document Revised: 05/15/2020 Document Reviewed: 04/22/2018 Elsevier Patient Education  2021 Elsevier Inc.  

## 2020-09-29 LAB — IGP, APTIMA HPV, RFX 16/18,45
HPV Aptima: NEGATIVE
PAP Smear Comment: 0

## 2020-09-30 LAB — NUSWAB VG, CANDIDA 6SP
Atopobium vaginae: HIGH Score — AB
BVAB 2: HIGH Score — AB
C PARAPSILOSIS/TROPICALIS: NEGATIVE
Candida albicans, NAA: NEGATIVE
Candida glabrata, NAA: NEGATIVE
Candida krusei, NAA: NEGATIVE
Candida lusitaniae, NAA: NEGATIVE
Megasphaera 1: HIGH Score — AB
Trich vag by NAA: NEGATIVE

## 2020-10-01 ENCOUNTER — Other Ambulatory Visit: Payer: Self-pay

## 2020-10-01 ENCOUNTER — Ambulatory Visit
Admission: RE | Admit: 2020-10-01 | Discharge: 2020-10-01 | Disposition: A | Discharge status: Home / Self Care | Payer: 59 | Source: Ambulatory Visit | Attending: Advanced Practice Midwife | Admitting: Advanced Practice Midwife

## 2020-10-01 ENCOUNTER — Other Ambulatory Visit: Payer: Self-pay | Admitting: *Deleted

## 2020-10-01 ENCOUNTER — Encounter: Payer: Self-pay | Admitting: *Deleted

## 2020-10-01 DIAGNOSIS — R928 Other abnormal and inconclusive findings on diagnostic imaging of breast: Secondary | ICD-10-CM | POA: Diagnosis not present

## 2020-10-01 DIAGNOSIS — Z01419 Encounter for gynecological examination (general) (routine) without abnormal findings: Secondary | ICD-10-CM

## 2020-10-01 DIAGNOSIS — Z1231 Encounter for screening mammogram for malignant neoplasm of breast: Secondary | ICD-10-CM | POA: Diagnosis present

## 2020-10-01 MED ORDER — METRONIDAZOLE 500 MG PO TABS: 500.0000 mg | ORAL_TABLET | Freq: Two times a day (BID) | ORAL | 0 refills | Status: DC

## 2020-10-02 ENCOUNTER — Other Ambulatory Visit: Payer: Self-pay | Admitting: Advanced Practice Midwife

## 2020-10-02 ENCOUNTER — Other Ambulatory Visit: Payer: Self-pay

## 2020-10-02 DIAGNOSIS — R928 Other abnormal and inconclusive findings on diagnostic imaging of breast: Secondary | ICD-10-CM

## 2020-10-02 DIAGNOSIS — N6489 Other specified disorders of breast: Secondary | ICD-10-CM

## 2020-10-03 ENCOUNTER — Other Ambulatory Visit: Payer: Self-pay | Admitting: Advanced Practice Midwife

## 2020-10-09 ENCOUNTER — Telehealth: Payer: Self-pay | Admitting: Advanced Practice Midwife

## 2020-10-09 NOTE — Telephone Encounter (Signed)
Attempted to call patient to discuss mammogram results, provide reassurance and answer questions as necessary. Call ran straight to voicemail, mailbox full, unable to leave message.   Clayton Bibles, MSN, CNM Certified Nurse Midwife, [facility] [employer], [facility] 10/09/20 5:55 PM

## 2020-10-11 ENCOUNTER — Other Ambulatory Visit: Payer: Self-pay | Admitting: Advanced Practice Midwife

## 2020-10-11 DIAGNOSIS — R928 Other abnormal and inconclusive findings on diagnostic imaging of breast: Secondary | ICD-10-CM

## 2020-10-11 DIAGNOSIS — N6489 Other specified disorders of breast: Secondary | ICD-10-CM

## 2020-10-12 ENCOUNTER — Other Ambulatory Visit: Payer: Self-pay

## 2020-10-12 ENCOUNTER — Ambulatory Visit: Payer: 59

## 2020-10-12 ENCOUNTER — Ambulatory Visit
Admission: RE | Admit: 2020-10-12 | Discharge: 2020-10-12 | Disposition: A | Discharge status: Home / Self Care | Payer: 59 | Source: Ambulatory Visit | Attending: Advanced Practice Midwife | Admitting: Advanced Practice Midwife

## 2020-10-12 DIAGNOSIS — R928 Other abnormal and inconclusive findings on diagnostic imaging of breast: Secondary | ICD-10-CM | POA: Insufficient documentation

## 2020-10-12 DIAGNOSIS — N6489 Other specified disorders of breast: Secondary | ICD-10-CM | POA: Diagnosis present

## 2020-10-16 ENCOUNTER — Telehealth: Payer: Self-pay | Admitting: Advanced Practice Midwife

## 2020-10-16 NOTE — Telephone Encounter (Signed)
Attempted to call patient Sunday 10/14/2020 to discuss results of diagnostic mammogram. Phone went straight to voicemail, message that voicemail box is full. No opportunity to leave voicemail.  Clayton Bibles, MSN, CNM Certified Nurse Midwife, [employer] for [employer], [facility] 10/16/20 10:12 AM

## 2020-10-25 ENCOUNTER — Other Ambulatory Visit: Payer: Self-pay

## 2020-10-25 ENCOUNTER — Ambulatory Visit (INDEPENDENT_AMBULATORY_CARE_PROVIDER_SITE_OTHER): Payer: 59 | Admitting: Family Medicine

## 2020-10-25 ENCOUNTER — Encounter: Payer: Self-pay | Admitting: Family Medicine

## 2020-10-25 VITALS — BP 101/63 | HR 89 | Wt 137.4 lb

## 2020-10-25 DIAGNOSIS — N951 Menopausal and female climacteric states: Secondary | ICD-10-CM

## 2020-10-25 DIAGNOSIS — Z780 Asymptomatic menopausal state: Secondary | ICD-10-CM | POA: Insufficient documentation

## 2020-10-25 NOTE — Assessment & Plan Note (Addendum)
Check TSH and FSH. Suspect menopause. She is concerned about pregnancy risks and is using Plan B to help with this. Wants to avoid pregnancy. Advised if in menopause which is likely given absence of cycles and climacteric symptoms, would be unable to achieve pregnancy. Discussed treatment options including lifestyle, herbal supplements, SSRI's, clonidine and HRT. Has no h/o CAD, CVA, does have breast cancer risk. Would need uterine protection. Advised of HRT risks and benefits.  Discussed bone loss, supplemental Ca and Vitamin D, heart health, breast cancer.  Also discussed changes related to menopause, normal progression and resolution of symptoms.  Start with labs and she will research and consider options.

## 2020-10-25 NOTE — Progress Notes (Signed)
Pt states she has severe mood swings, hot flashes, and sweating at night time.

## 2020-10-25 NOTE — Progress Notes (Signed)
    Subjective:    Patient ID: Samantha Williams is a 45 y.o. female presenting with chief complaint of hot flashes on 10/25/2020  HPI: Cycles initially were regular. Cycles then became irregular. On Depo for some time and had no cylce. Had 2 kids, then back on Depo. Then came back regularly and were heavy. No cycle and no birth control x 2 years. Having bad night sweats, mood swings. Weight fluctuation. Hot flashes x 2-3 years.  Review of Systems  Constitutional: Negative for chills and fever.  Respiratory: Negative for shortness of breath.   Cardiovascular: Negative for chest pain.  Gastrointestinal: Negative for abdominal pain, nausea and vomiting.  Genitourinary: Negative for dysuria.  Skin: Negative for rash.      Objective:    BP 101/63   Pulse 89   Wt 137 lb 6.4 oz (62.3 kg)   BMI 25.96 kg/m  Physical Exam Constitutional:      General: She is not in acute distress.    Appearance: She is well-developed and well-nourished.  HENT:     Head: Normocephalic and atraumatic.  Eyes:     General: No scleral icterus. Cardiovascular:     Rate and Rhythm: Normal rate.  Pulmonary:     Effort: Pulmonary effort is normal.  Abdominal:     Palpations: Abdomen is soft.  Musculoskeletal:     Cervical back: Neck supple.  Skin:    General: Skin is warm and dry.  Neurological:     Mental Status: She is alert and oriented to person, place, and time.  Psychiatric:        Mood and Affect: Mood and affect normal.         Assessment & Plan:   Problem List Items Addressed This Visit      Unprioritized   Perimenopausal vasomotor symptoms - Primary    Check TSH and FSH. Suspect menopause. She is concerned about pregnancy risks and is using Plan B to help with this. Wants to avoid pregnancy. Advised if in menopause which is likely given absence of cycles and climacteric symptoms, would be unable to achieve pregnancy. Discussed treatment options including lifestyle, herbal supplements,  SSRI's, clonidine and HRT. Has no h/o CAD, CVA, does have breast cancer risk. Would need uterine protection. Advised of HRT risks and benefits.  Discussed bone loss, supplemental Ca and Vitamin D, heart health, breast cancer.  Also discussed changes related to menopause, normal progression and resolution of symptoms.  Start with labs and she will research and consider options.       Relevant Orders   Follicle stimulating hormone   TSH      Total time in review of prior notes,  labs, history taking, review with patient, exam, note writing, discussion of options, plan for next steps, alternatives and risks of treatments as outlined in above assessment: 25 minutes.  Return in about 3 weeks (around 11/15/2020) for a follow-up.  Reva Bores 10/25/2020 10:17 AM

## 2020-10-25 NOTE — Patient Instructions (Signed)
https://www.womenshealth.gov/menopause/menopause-basics"> https://www.clinicalkey.com">  Menopause Menopause is the normal time of a woman's life when menstrual periods stop completely. It marks the natural end to a woman's ability to become pregnant. It can be defined as the absence of a menstrual period for 12 months without another medical cause. The transition to menopause (perimenopause) most often happens between the ages of 45 and 55, and can last for many years. During perimenopause, hormone levels change in your body, which can cause symptoms and affect your health. Menopause may increase your risk for:  Weakened bones (osteoporosis), which causes fractures.  Depression.  Hardening and narrowing of the arteries (atherosclerosis), which can cause heart attacks and strokes. What are the causes? This condition is usually caused by a natural change in hormone levels that happens as you get older. The condition may also be caused by changes that are not natural, including:  Surgery to remove both ovaries (surgical menopause).  Side effects from some medicines, such as chemotherapy used to treat cancer (chemical menopause). What increases the risk? This condition is more likely to start at an earlier age if you have certain medical conditions or have undergone treatments, including:  A tumor of the pituitary gland in the brain.  A disease that affects the ovaries and hormones.  Certain cancer treatments, such as chemotherapy or hormone therapy, or radiation therapy on the pelvis.  Heavy smoking and excessive alcohol use.  Family history of early menopause. This condition is also more likely to develop earlier in women who are very thin. What are the signs or symptoms? Symptoms of this condition include:  Hot flashes.  Irregular menstrual periods.  Night sweats.  Changes in feelings about sex. This could be a decrease in sex drive or an increased discomfort around your  sexuality.  Vaginal dryness and thinning of the vaginal walls. This may cause painful sex.  Dryness of the skin and development of wrinkles.  Headaches.  Problems sleeping (insomnia).  Mood swings or irritability.  Memory problems.  Weight gain.  Hair growth on the face and chest.  Bladder infections or problems with urinating. How is this diagnosed? This condition is diagnosed based on your medical history, a physical exam, your age, your menstrual history, and your symptoms. Hormone tests may also be done. How is this treated? In some cases, no treatment is needed. You and your health care provider should make a decision together about whether treatment is necessary. Treatment will be based on your individual condition and preferences. Treatment for this condition focuses on managing symptoms. Treatment may include:  Menopausal hormone therapy (MHT).  Medicines to treat specific symptoms or complications.  Acupuncture.  Vitamin or herbal supplements. Before starting treatment, make sure to let your health care provider know if you have a personal or family history of these conditions:  Heart disease.  Breast cancer.  Blood clots.  Diabetes.  Osteoporosis. Follow these instructions at home: Lifestyle  Do not use any products that contain nicotine or tobacco, such as cigarettes, e-cigarettes, and chewing tobacco. If you need help quitting, ask your health care provider.  Get at least 30 minutes of physical activity on 5 or more days each week.  Avoid alcoholic and caffeinated beverages, as well as spicy foods. This may help prevent hot flashes.  Get 7-8 hours of sleep each night.  If you have hot flashes, try: ? Dressing in layers. ? Avoiding things that may trigger hot flashes, such as spicy food, warm places, or stress. ? Taking slow, deep   breaths when a hot flash starts. ? Keeping a fan in your home and office.  Find ways to manage stress, such as deep  breathing, meditation, or journaling.  Consider going to group therapy with other women who are having menopause symptoms. Ask your health care provider about recommended group therapy meetings. Eating and drinking  Eat a healthy, balanced diet that contains whole grains, lean protein, low-fat dairy, and plenty of fruits and vegetables.  Your health care provider may recommend adding more soy to your diet. Foods that contain soy include tofu, tempeh, and soy milk.  Eat plenty of foods that contain calcium and vitamin D for bone health. Items that are rich in calcium include low-fat milk, yogurt, beans, almonds, sardines, broccoli, and kale.   Medicines  Take over-the-counter and prescription medicines only as told by your health care provider.  Talk with your health care provider before starting any herbal supplements. If prescribed, take vitamins and supplements as told by your health care provider. General instructions  Keep track of your menstrual periods, including: ? When they occur. ? How heavy they are and how long they last. ? How much time passes between periods.  Keep track of your symptoms, noting when they start, how often you have them, and how long they last.  Use vaginal lubricants or moisturizers to help with vaginal dryness and improve comfort during sex.  Keep all follow-up visits. This is important. This includes any group therapy or counseling.   Contact a health care provider if:  You are still having menstrual periods after age 55.  You have pain during sex.  You have not had a period for 12 months and you develop vaginal bleeding. Get help right away if you have:  Severe depression.  Excessive vaginal bleeding.  Pain when you urinate.  A fast or irregular heartbeat (palpitations).  Severe headaches.  Abdominal pain or severe indigestion. Summary  Menopause is a normal time of life when menstrual periods stop completely. It is usually defined as  the absence of a menstrual period for 12 months without another medical cause.  The transition to menopause (perimenopause) most often happens between the ages of 45 and 55 and can last for several years.  Symptoms can be managed through medicines, lifestyle changes, and complementary therapies such as acupuncture.  Eat a balanced diet that is rich in nutrients to promote bone health and heart health and to manage symptoms during menopause. This information is not intended to replace advice given to you by your health care provider. Make sure you discuss any questions you have with your health care provider. Document Revised: 05/11/2020 Document Reviewed: 01/26/2020 Elsevier Patient Education  2021 Elsevier Inc.  

## 2020-10-26 LAB — FOLLICLE STIMULATING HORMONE: FSH: 89.2 m[IU]/mL

## 2020-10-26 LAB — TSH: TSH: 1.41 u[IU]/mL (ref 0.450–4.500)

## 2020-11-05 ENCOUNTER — Encounter: Payer: Self-pay | Admitting: Radiology

## 2020-11-15 ENCOUNTER — Other Ambulatory Visit: Payer: Self-pay

## 2020-11-15 ENCOUNTER — Encounter: Payer: Self-pay | Admitting: Family Medicine

## 2020-11-15 ENCOUNTER — Ambulatory Visit (INDEPENDENT_AMBULATORY_CARE_PROVIDER_SITE_OTHER): Payer: 59 | Admitting: Family Medicine

## 2020-11-15 DIAGNOSIS — Z780 Asymptomatic menopausal state: Secondary | ICD-10-CM | POA: Diagnosis not present

## 2020-11-15 MED ORDER — VENLAFAXINE HCL ER 37.5 MG PO CP24: 37.5000 mg | ORAL_CAPSULE | Freq: Every day | ORAL | 1 refills | Status: DC

## 2020-11-15 NOTE — Patient Instructions (Signed)

## 2020-11-15 NOTE — Progress Notes (Addendum)
    Subjective:    Patient ID: Samantha Williams is a 45 y.o. female presenting with Follow-up  on 11/15/2020  HPI: Here to f/u from menopausal symptoms. She reports no cycle x 2 years. Using Plan B for pregnancy prevention. Labs reveal menopause with FSH 88. Patient is still having bad night sweats, mood swings. Poor mood, poor sleeping. Has little energy. GAD 7 is + today. She reports difficulty in turning her mind off to allow her to sleep. She is a smoker.  Review of Systems  Constitutional: Negative for chills and fever. Unexpected weight change: weight gain.  Respiratory: Negative for shortness of breath.   Cardiovascular: Negative for chest pain.  Gastrointestinal: Negative for abdominal pain, nausea and vomiting.  Genitourinary: Negative for dysuria.  Skin: Negative for rash.  Psychiatric/Behavioral: Positive for dysphoric mood and sleep disturbance. The patient is nervous/anxious.       Objective:    BP 120/78   Pulse 91   Wt 138 lb (62.6 kg)   BMI 26.07 kg/m  Physical Exam Constitutional:      General: She is not in acute distress.    Appearance: She is well-developed.  HENT:     Head: Normocephalic and atraumatic.  Eyes:     General: No scleral icterus. Cardiovascular:     Rate and Rhythm: Normal rate.  Pulmonary:     Effort: Pulmonary effort is normal.  Abdominal:     Palpations: Abdomen is soft.  Musculoskeletal:     Cervical back: Neck supple.  Skin:    General: Skin is warm and dry.  Neurological:     Mental Status: She is alert and oriented to person, place, and time.         Assessment & Plan:   Problem List Items Addressed This Visit      Unprioritized   Menopause    Can stop Plan B. Continue Lifestyle changes with diet and exercise. Add mindfulness. Discussed risks of HRT with smoking. Will try Effexor. Usual effective dose, side effects, time to onset of effectiveness discussed. Bone loss and prevention discussed at length.        Relevant Medications   venlafaxine XR (EFFEXOR XR) 37.5 MG 24 hr capsule      Return in about 4 weeks (around 12/13/2020) for virtual.  Reva Bores 11/15/2020 5:23 PM

## 2020-11-15 NOTE — Assessment & Plan Note (Signed)
Can stop Plan B. Continue Lifestyle changes with diet and exercise. Add mindfulness. Discussed risks of HRT with smoking. Will try Effexor. Usual effective dose, side effects, time to onset of effectiveness discussed. Bone loss and prevention discussed at length.

## 2020-11-15 NOTE — Progress Notes (Signed)
GAD7# 18

## 2020-12-07 ENCOUNTER — Other Ambulatory Visit: Payer: Self-pay | Admitting: Family Medicine

## 2020-12-07 DIAGNOSIS — Z780 Asymptomatic menopausal state: Secondary | ICD-10-CM

## 2020-12-11 ENCOUNTER — Other Ambulatory Visit: Payer: Self-pay

## 2020-12-11 ENCOUNTER — Telehealth (INDEPENDENT_AMBULATORY_CARE_PROVIDER_SITE_OTHER): Payer: 59 | Admitting: Family Medicine

## 2020-12-11 ENCOUNTER — Encounter: Payer: Self-pay | Admitting: Family Medicine

## 2020-12-11 DIAGNOSIS — Z780 Asymptomatic menopausal state: Secondary | ICD-10-CM | POA: Diagnosis not present

## 2020-12-11 NOTE — Assessment & Plan Note (Signed)
Hot flashes have improved somewhat. Only 3 weeks in, will give it another couple of weeks to see if this continues. Sleep has not improved. May need dose increase--to message back on MyChart and can increase dose then. Reviewed side effects.

## 2020-12-11 NOTE — Progress Notes (Signed)
    GYNECOLOGY VIRTUAL VISIT ENCOUNTER NOTE  Provider location: [facility] for [facility]   Patient location: Home  I connected with Samantha Williams on 12/11/20 at 11:30 AM EDT by MyChart Video Encounter and verified that I am speaking with the correct person using two identifiers.   I discussed the limitations, risks, security and privacy concerns of performing an evaluation and management service virtually and the availability of in person appointments. I also discussed with the patient that there may be a patient responsible charge related to this service. The patient expressed understanding and agreed to proceed.   History:  Samantha Williams is a 45 y.o. G46P2012 female being evaluated today for menopause. Did not start her Effexor XR until 3 weeks ago. Notes it makes her drowsy. Still not having enough energy. Starting to feel a bit better. She denies any abnormal vaginal discharge, bleeding, pelvic pain or other concerns.       No past medical history on file. Past Surgical History:  Procedure Laterality Date  . WISDOM TOOTH EXTRACTION     The following portions of the patient's history were reviewed and updated as appropriate: allergies, current medications, past family history, past medical history, past social history, past surgical history and problem list.   Health Maintenance:  Normal pap and negative HRHPV on 11/15/2020.  Normal mammogram on 10/12/2020.   Review of Systems:  Pertinent items noted in HPI and remainder of comprehensive ROS otherwise negative.  Physical Exam:   General:  Alert, oriented and cooperative. Patient appears to be in no acute distress.  Mental Status: Normal mood and affect. Normal behavior. Normal judgment and thought content.   Respiratory: Normal respiratory effort, no problems with respiration noted  Rest of physical exam deferred due to type of encounter  Labs and Imaging No results found for this or any previous  visit (from the past 336 hour(s)). No results found.     Assessment and Plan:     Problem List Items Addressed This Visit      Unprioritized   Menopause - Primary    Hot flashes have improved somewhat. Only 3 weeks in, will give it another couple of weeks to see if this continues. Sleep has not improved. May need dose increase--to message back on MyChart and can increase dose then. Reviewed side effects.              I discussed the assessment and treatment plan with the patient. The patient was provided an opportunity to ask questions and all were answered. The patient agreed with the plan and demonstrated an understanding of the instructions.   The patient was advised to call back or seek an in-person evaluation/go to the ED if the symptoms worsen or if the condition fails to improve as anticipated.  I provided 8 minutes of face-to-face time during this encounter. Total time in review of notes, documentation and review of health maintenance issues was 10 minutes.    Reva Bores, MD Center for [employer], [facility]

## 2020-12-11 NOTE — Progress Notes (Signed)
GAD 7: 15

## 2020-12-24 DIAGNOSIS — Z780 Asymptomatic menopausal state: Secondary | ICD-10-CM

## 2020-12-26 MED ORDER — VENLAFAXINE HCL ER 75 MG PO CP24: 75.0000 mg | ORAL_CAPSULE | Freq: Every day | ORAL | 1 refills | Status: DC

## 2021-03-13 DIAGNOSIS — Z780 Asymptomatic menopausal state: Secondary | ICD-10-CM

## 2021-03-18 MED ORDER — VENLAFAXINE HCL ER 150 MG PO CP24: 150.0000 mg | ORAL_CAPSULE | Freq: Every day | ORAL | 3 refills | Status: DC

## 2021-04-22 DIAGNOSIS — M25512 Pain in left shoulder: Secondary | ICD-10-CM | POA: Insufficient documentation

## 2021-04-22 DIAGNOSIS — M778 Other enthesopathies, not elsewhere classified: Secondary | ICD-10-CM | POA: Insufficient documentation

## 2021-05-02 ENCOUNTER — Encounter: Payer: Self-pay | Admitting: Family Medicine

## 2021-05-02 ENCOUNTER — Telehealth (INDEPENDENT_AMBULATORY_CARE_PROVIDER_SITE_OTHER): Payer: 59 | Admitting: Family Medicine

## 2021-05-02 ENCOUNTER — Other Ambulatory Visit: Payer: Self-pay

## 2021-05-02 DIAGNOSIS — R232 Flushing: Secondary | ICD-10-CM | POA: Diagnosis not present

## 2021-05-02 DIAGNOSIS — N951 Menopausal and female climacteric states: Secondary | ICD-10-CM | POA: Diagnosis not present

## 2021-05-02 DIAGNOSIS — Z780 Asymptomatic menopausal state: Secondary | ICD-10-CM

## 2021-05-02 NOTE — Progress Notes (Signed)
    GYNECOLOGY VIRTUAL VISIT ENCOUNTER NOTE  Provider location: [facility] for [facility]   Patient location: Home  I connected with [name] on 05/02/21 at 10:35 AM EDT by MyChart Video Encounter and verified that I am speaking with the correct person using two identifiers.   I discussed the limitations, risks, security and privacy concerns of performing an evaluation and management service virtually and the availability of in person appointments. I also discussed with the patient that there may be a patient responsible charge related to this service. The patient expressed understanding and agreed to proceed.   History:  JACQUILYN SELDON is a 45 y.o. G47P2012 female being evaluated today for menopause symptoms.  Notes her anxiety is improved. Her hot flashes and night sweats are better, but not completely improved.  She denies any abnormal vaginal discharge, bleeding, pelvic pain or other concerns.       History reviewed. No pertinent past medical history. Past Surgical History:  Procedure Laterality Date   WISDOM TOOTH EXTRACTION     The following portions of the patient's history were reviewed and updated as appropriate: allergies, current medications, past family history, past medical history, past social history, past surgical history and problem list.   Health Maintenance:  Normal pap and negative HRHPV on 09/26/20.  Normal mammogram on 10/12/20.   Review of Systems:  Pertinent items noted in HPI and remainder of comprehensive ROS otherwise negative.  Physical Exam:   General:  Alert, oriented and cooperative. Patient appears to be in no acute distress.  Mental Status: Normal mood and affect. Normal behavior. Normal judgment and thought content.   Respiratory: Normal respiratory effort, no problems with respiration noted  Rest of physical exam deferred due to type of encounter  Labs and Imaging No results found for this or any previous visit (from  the past 336 hour(s)). No results found.     Assessment and Plan:   Problem List Items Addressed This Visit       Unprioritized   Menopause - Primary    Better on increased dose of Effexor. She is a smoker and risks of HRT including DVT, PE, MI and CVA reviewed. She does not want to attempt HRT at this time. Will continue Effexor.            I discussed the assessment and treatment plan with the patient. The patient was provided an opportunity to ask questions and all were answered. The patient agreed with the plan and demonstrated an understanding of the instructions.   The patient was advised to call back or seek an in-person evaluation/go to the ED if the symptoms worsen or if the condition fails to improve as anticipated.  I provided 9 minutes of face-to-face time during this encounter.   Reva Bores, MD [facility] for [employer], [facility]

## 2021-05-02 NOTE — Progress Notes (Signed)
I connected with  Samantha Williams on 05/02/21 at 10:35 AM EDT by telephone and verified that I am speaking with the correct person using two identifiers.   I discussed the limitations, risks, security and privacy concerns of performing an evaluation and management service by telephone and the availability of in person appointments. I also discussed with the patient that there may be a patient responsible charge related to this service. The patient expressed understanding and agreed to proceed.  Scheryl Marten, RN 05/02/2021  10:53 AM

## 2021-05-03 ENCOUNTER — Encounter: Payer: Self-pay | Admitting: Family Medicine

## 2021-05-03 NOTE — Assessment & Plan Note (Signed)
Better on increased dose of Effexor. She is a smoker and risks of HRT including DVT, PE, MI and CVA reviewed. She does not want to attempt HRT at this time. Will continue Effexor.

## 2021-08-20 ENCOUNTER — Encounter: Payer: Self-pay | Admitting: Radiology

## 2021-10-22 DIAGNOSIS — M7712 Lateral epicondylitis, left elbow: Secondary | ICD-10-CM | POA: Diagnosis not present

## 2021-11-19 ENCOUNTER — Other Ambulatory Visit: Payer: Self-pay | Admitting: *Deleted

## 2021-11-19 DIAGNOSIS — Z780 Asymptomatic menopausal state: Secondary | ICD-10-CM

## 2021-11-19 MED ORDER — VENLAFAXINE HCL ER 150 MG PO CP24: 150.0000 mg | ORAL_CAPSULE | Freq: Every day | ORAL | 1 refills | Status: DC

## 2021-11-29 DIAGNOSIS — M7712 Lateral epicondylitis, left elbow: Secondary | ICD-10-CM | POA: Diagnosis not present

## 2021-12-13 DIAGNOSIS — M7712 Lateral epicondylitis, left elbow: Secondary | ICD-10-CM | POA: Diagnosis not present

## 2021-12-13 DIAGNOSIS — M25512 Pain in left shoulder: Secondary | ICD-10-CM | POA: Diagnosis not present

## 2022-06-25 ENCOUNTER — Other Ambulatory Visit: Payer: Self-pay | Admitting: Family Medicine

## 2022-06-25 DIAGNOSIS — Z780 Asymptomatic menopausal state: Secondary | ICD-10-CM

## 2022-07-10 ENCOUNTER — Encounter: Payer: Self-pay | Admitting: Advanced Practice Midwife

## 2022-07-10 DIAGNOSIS — Z780 Asymptomatic menopausal state: Secondary | ICD-10-CM

## 2022-07-11 ENCOUNTER — Other Ambulatory Visit: Payer: Self-pay | Admitting: Family Medicine

## 2022-07-11 MED ORDER — VENLAFAXINE HCL ER 150 MG PO CP24: 150.0000 mg | ORAL_CAPSULE | Freq: Every day | ORAL | 1 refills | Status: DC

## 2022-07-29 ENCOUNTER — Encounter: Payer: Self-pay | Admitting: Physician Assistant

## 2022-07-29 ENCOUNTER — Ambulatory Visit: Payer: 59 | Admitting: Physician Assistant

## 2022-07-29 VITALS — BP 111/75 | HR 88 | Ht 60.0 in | Wt 128.3 lb

## 2022-07-29 DIAGNOSIS — Z114 Encounter for screening for human immunodeficiency virus [HIV]: Secondary | ICD-10-CM | POA: Diagnosis not present

## 2022-07-29 DIAGNOSIS — Z136 Encounter for screening for cardiovascular disorders: Secondary | ICD-10-CM

## 2022-07-29 DIAGNOSIS — R109 Unspecified abdominal pain: Secondary | ICD-10-CM | POA: Diagnosis not present

## 2022-07-29 DIAGNOSIS — Z7689 Persons encountering health services in other specified circumstances: Secondary | ICD-10-CM | POA: Diagnosis not present

## 2022-07-29 DIAGNOSIS — J301 Allergic rhinitis due to pollen: Secondary | ICD-10-CM

## 2022-07-29 DIAGNOSIS — G4489 Other headache syndrome: Secondary | ICD-10-CM | POA: Diagnosis not present

## 2022-07-29 DIAGNOSIS — R69 Illness, unspecified: Secondary | ICD-10-CM | POA: Diagnosis not present

## 2022-07-29 DIAGNOSIS — Z1159 Encounter for screening for other viral diseases: Secondary | ICD-10-CM

## 2022-07-29 DIAGNOSIS — K5909 Other constipation: Secondary | ICD-10-CM | POA: Diagnosis not present

## 2022-07-29 DIAGNOSIS — Z780 Asymptomatic menopausal state: Secondary | ICD-10-CM | POA: Diagnosis not present

## 2022-07-29 DIAGNOSIS — Z1211 Encounter for screening for malignant neoplasm of colon: Secondary | ICD-10-CM

## 2022-07-29 NOTE — Progress Notes (Unsigned)
I,Sha'taria Tyson,acting as a Neurosurgeon for [employer], PA-C.,have documented all relevant documentation on the behalf of Debera Lat, PA-C,as directed by  [employer], PA-C while in the presence of [employer], PA-C.   New patient visit   Patient: Samantha Williams   DOB: 1976-06-28   46 y.o. Female  MRN: [id number] Visit Date: 07/29/2022  Today's healthcare provider: Debera Lat, PA-C   No chief complaint on file.  Subjective    Samantha Williams is a 46 y.o. female who presents today as a new patient to establish care.  HPI  -colonoscopy/cologuard? Would like info and to verify through insurance to see which is covered -Well woman exam 09/26/20; next due 09/28/23 -Patient reports having stomach concerns such as not being able to go to the restroom, at times she is congested and/or can not go at all and then there are times when she goes its "terrible" the patient states. States she had minor movement this morning but not much, outside of that it could have been a week or more. States it is times she will go almost 2 weeks without going that results into a lot of cramping and her having to stay in the restroom once she finally is able to go Has been suffering for depression and anxiety, lost a child and parents when she was 30 yo. Lives with brother who has been sick Her daughter is currently pregnant. Current day smoker up to 1 pack a day Headache due to stress   No past medical history on file. Past Surgical History:  Procedure Laterality Date   WISDOM TOOTH EXTRACTION     Family Status  Relation Name Status   MGM  (Not Specified)   Father  (Not Specified)   Family History  Problem Relation Age of Onset   Breast cancer Maternal Grandmother    Cancer Father        lung - smoker   Social History   Socioeconomic History   Marital status: Married    Spouse name: Not on file   Number of children: Not on file   Years of education: Not on file   Highest  education level: Not on file  Occupational History   Not on file  Tobacco Use   Smoking status: Every Day    Packs/day: 0.50    Types: Cigarettes   Smokeless tobacco: Never  Vaping Use   Vaping Use: Never used  Substance and Sexual Activity   Alcohol use: No   Drug use: No   Sexual activity: Not Currently  Other Topics Concern   Not on file  Social History Narrative   Not on file   Social Determinants of Health   Financial Resource Strain: Not on file  Food Insecurity: Not on file  Transportation Needs: Not on file  Physical Activity: Not on file  Stress: Not on file  Social Connections: Not on file   Outpatient Medications Prior to Visit  Medication Sig   Ascorbic Acid (VITAMIN C) 100 MG tablet Take 100 mg by mouth daily.   cholecalciferol (VITAMIN D3) 25 MCG (1000 UNIT) tablet Take 1,000 Units by mouth daily.   pantoprazole (PROTONIX) 40 MG tablet Take 1 tablet (40 mg total) by mouth daily.   venlafaxine XR (EFFEXOR-XR) 150 MG 24 hr capsule Take 1 capsule (150 mg total) by mouth daily.   No facility-administered medications prior to visit.   No Known Allergies  Immunization History  Administered Date(s) Administered   Tdap  08/25/2016    Health Maintenance  Topic Date Due   COVID-19 Vaccine (1) Never done   HIV Screening  Never done   Hepatitis C Screening  Never done   COLONOSCOPY (Pts 45-50yrs Insurance coverage will need to be confirmed)  Never done   INFLUENZA VACCINE  Never done   PAP SMEAR-Modifier  09/27/2023   DTaP/Tdap/Td (2 - Td or Tdap) 08/25/2026   HPV VACCINES  Aged Out    Patient Care Team: Patient, No Pcp Per as PCP - General (General Practice)  Review of Systems  {Labs  Heme  Chem  Endocrine  Serology  Results Review (optional):23779}   Objective    There were no vitals taken for this visit. {Show previous vital signs (optional):23777}  Physical Exam ***  Depression Screen     No data to display         No results  found for any visits on 07/29/22.  Assessment & Plan     *** .The patient was advised to call back or seek an in-person evaluation if the symptoms worsen or if the condition fails to improve as anticipated.  I discussed the assessment and treatment plan with the patient. The patient was provided an opportunity to ask questions and all were answered. The patient agreed with the plan and demonstrated an understanding of the instructions.  The entirety of the information documented in the History of Present Illness, Review of Systems and Physical Exam were personally obtained by me. Portions of this information were initially documented by the CMA and reviewed by me for thoroughness and accuracy.  Debera Lat, [facility], MMS [facility] [phone] (phone) [phone] (fax)

## 2022-07-29 NOTE — Patient Instructions (Signed)
Colonoscopy, Adult A colonoscopy is an exam to look at the large intestine. It is done using a long, thin, flexible tube that has a camera on the end. This exam is done to check for problems, such as: An abnormal growth of cells or tissue (tumor). Abnormal growths within the lining of your intestine (polyps). Irritation and swelling (inflammation). Bleeding. Tell your doctor about: Any allergies you have. All medicines you are taking. Tell him or her about vitamins, herbs, eye drops, creams, and over-the-counter medicines. Any problems you or family members have had with anesthetic medicines. Any bleeding problems you have. Any surgeries you have had. Any medical conditions you have. Any problems you have had with pooping (having bowel movements). Whether you are pregnant or may be pregnant. What are the risks? Generally, this is a safe procedure. However, problems may occur, such as: Bleeding. Damage to your intestine. Allergic reactions to medicines given during the procedure. Infection. This is rare. What happens before the procedure? Eating and drinking Follow instructions from your doctor about eating and drinking. These may include: A few days before the procedure: Follow a low-fiber diet. Avoid these foods: Nuts. Seeds. Dried fruit. Raw fruits. Vegetables. 1-3 days before the procedure: Eat only gelatin dessert or ice pops. Drink only clear liquids, such as: Water. Clear broth or bouillon. Black coffee or tea. Clear juice. Clear soft drinks or sports drinks. Avoid liquids that have red or purple dye. On the day of the procedure: Do not eat solid foods. You may continue to drink clear liquids until up to 2 hours before the procedure. Do not eat or drink anything starting 2 hours before the procedure or as told by your doctor. Bowel prep If you were prescribed a bowel prep to take by mouth (orally) to clean out your colon: Take it as told by your doctor. Starting  the day before your procedure, you will need to drink a lot of liquid medicine. The liquid will cause you to poop until your poop is almost clear or light green. If your skin or butt gets irritated from diarrhea, you may: Wipe the area with wipes that have medicine in them, such as adult wet wipes with aloe and vitamin E. Put something on your skin that soothes the area, such as petroleum jelly. If you vomit while drinking the bowel prep: Take a break for up to 60 minutes. Begin the bowel prep again. Call your doctor if you keep vomiting and you cannot take the bowel prep without vomiting. To clean out your colon, you may also be given: Laxative medicines. These help you poop. Instructions for using a liquid medicine (enema) injected into your butt. Medicines Ask your doctor about changing or stopping: Your normal medicines. Vitamins, herbs, and supplements. Over-the-counter medicines. Do not take aspirin or ibuprofen unless you are told to. General instructions Ask your doctor what steps will be taken to prevent the spread of germs. These may include washing skin with a soap that kills germs. If you will be going home right after the procedure, plan to have a responsible adult: Take you home from the hospital or clinic. You will not be allowed to drive. Care for you for the time you are told. What happens during the procedure?  An IV tube will be put into one of your veins. You will be given a medicine to make you fall asleep (general anesthetic). You will lie on your side with your knees bent. Oil or gel will be put on   the tube. Then the tube will be: Put into the opening of the butt (anus). Gently put into your large intestine. Air will be sent into your colon to keep it open. You may feel some pressure or cramping. The camera will be used to take photos that will appear on a screen. A small tissue sample may be removed for testing (biopsy). If small growths are found, your doctor  may remove them and have them checked for cancer. The tube will be slowly removed. The procedure may vary among doctors and hospitals. What happens after the procedure? You will be monitored until you leave the hospital or clinic. This includes checking your blood pressure, heart rate, breathing rate, and blood oxygen level. You may have a small amount of blood in your poop. You may pass gas. You may have mild cramps or bloating in your belly (abdomen). If you were given a sedative during your procedure, do not drive or use machines until your doctor says that it is safe. It is up to you to get the results of your procedure. Ask how to get your results when they are ready. Summary A colonoscopy is an exam to look at the large intestine. Follow instructions from your doctor about eating and drinking before the procedure. You may be prescribed an oral bowel prep to clean out your colon. Take it as told by your doctor. A flexible tube with a camera on its end will be put into the opening of the butt. It will be passed into the large intestine. This information is not intended to replace advice given to you by your health care provider. Make sure you discuss any questions you have with your health care provider. Document Revised: 08/05/2021 Document Reviewed: 04/03/2021 Elsevier Patient Education  2023 Elsevier Inc.  

## 2022-07-30 LAB — HIV ANTIBODY (ROUTINE TESTING W REFLEX): HIV Screen 4th Generation wRfx: NONREACTIVE

## 2022-07-30 LAB — LIPID PANEL
Chol/HDL Ratio: 5.4 ratio — ABNORMAL HIGH (ref 0.0–4.4)
Cholesterol, Total: 223 mg/dL — ABNORMAL HIGH (ref 100–199)
HDL: 41 mg/dL (ref >39)
LDL Chol Calc (NIH): 149 mg/dL — ABNORMAL HIGH (ref 0–99)
Triglycerides: 179 mg/dL — ABNORMAL HIGH (ref 0–149)
VLDL Cholesterol Cal: 33 mg/dL (ref 5–40)

## 2022-07-30 LAB — HEPATITIS C ANTIBODY: Hep C Virus Ab: NONREACTIVE

## 2022-07-30 NOTE — Progress Notes (Signed)
Please, let pt know that his lipids are elevated but hep C and HIV screenings were negative. Advised healthy low-carb low cholesterol diet and daily exercise.  Debera Lat, [facility], MMS [facility] [phone](phone) [phone](fax)

## 2022-08-07 ENCOUNTER — Ambulatory Visit (INDEPENDENT_AMBULATORY_CARE_PROVIDER_SITE_OTHER): Payer: 59 | Admitting: Advanced Practice Midwife

## 2022-08-07 ENCOUNTER — Encounter: Payer: Self-pay | Admitting: Advanced Practice Midwife

## 2022-08-07 VITALS — BP 107/75 | HR 116 | Ht 60.0 in | Wt 128.0 lb

## 2022-08-07 DIAGNOSIS — N951 Menopausal and female climacteric states: Secondary | ICD-10-CM | POA: Diagnosis not present

## 2022-08-07 DIAGNOSIS — Z0000 Encounter for general adult medical examination without abnormal findings: Secondary | ICD-10-CM | POA: Diagnosis not present

## 2022-08-07 NOTE — Progress Notes (Signed)
Patient presents for Annual.  BBU:[id number]  Last pap:09/26/2020 Pt does not want pap today.  STD Screening: Declined  Mammogram: 10/01/20  Family Hx of Breast Cancer.  Pt wants to discuss medication management Effexor-XR  Notes bad days, mood swings, hot sweats , and hot flashes.    Fun Fact: Patient loves Babies and Family takes care of older brother.

## 2022-08-09 NOTE — Progress Notes (Signed)
GYNECOLOGY ANNUAL PREVENTATIVE CARE ENCOUNTER NOTE  History:     Samantha Williams is a 46 y.o. G36P2012 female here for a routine annual gynecologic exam.  Current complaints: ongoing vasomotor symptoms which she is managing with Effexor.  Significant improvement but continues to be inconvenienced by hot flashes, night sweats and would like to discuss next level interventions.  Denies abnormal vaginal bleeding, discharge, pelvic pain, problems with intercourse or other gynecologic concerns.    Gynecologic History No LMP recorded. (Menstrual status: Perimenopausal). Contraception: post menopausal status Last Pap: 022022. Result was normal with negative HPV Last Mammogram: 09/2020.  Result was normal   Obstetric History OB History  Gravida Para Term Preterm AB Living  3 2 2   1 2   SAB IAB Ectopic Multiple Live Births  1       2    # Outcome Date GA Lbr Len/2nd Weight Sex Delivery Anes PTL Lv  3 Term 11/20/99    F Vag-Spont None  LIV  2 SAB 1999          1 Term 11/27/96    M Vag-Spont EPI  DEC    Past Medical History:  Diagnosis Date   Anxiety    Arthritis    Depression    GERD (gastroesophageal reflux disease)     Past Surgical History:  Procedure Laterality Date   WISDOM TOOTH EXTRACTION      Current Outpatient Medications on File Prior to Visit  Medication Sig Dispense Refill   Ascorbic Acid (VITAMIN C) 100 MG tablet Take 100 mg by mouth daily.     cholecalciferol (VITAMIN D3) 25 MCG (1000 UNIT) tablet Take 1,000 Units by mouth daily.     venlafaxine XR (EFFEXOR-XR) 150 MG 24 hr capsule Take 1 capsule (150 mg total) by mouth daily. 90 capsule 1   pantoprazole (PROTONIX) 40 MG tablet Take 1 tablet (40 mg total) by mouth daily. 30 tablet 1   No current facility-administered medications on file prior to visit.    No Known Allergies  Social History:  reports that she has been smoking cigarettes. She has a 15.00 pack-year smoking history. She has never used  smokeless tobacco. She reports that she does not drink alcohol and does not use drugs.  Family History  Problem Relation Age of Onset   Breast cancer Maternal Grandmother    Cancer Father        lung - smoker   Alcohol abuse Father    Arthritis Father    Alcohol abuse Mother    Depression Mother    COPD Brother    Drug abuse Brother    Early death Son    Miscarriages / 01/27/97 Daughter     The following portions of the patient's history were reviewed and updated as appropriate: allergies, current medications, past family history, past medical history, past social history, past surgical history and problem list.  Review of Systems Pertinent items noted in HPI and remainder of comprehensive ROS otherwise negative.  Physical Exam:  BP 107/75   Pulse (!) 116   Ht 5' (1.524 m)   Wt 128 lb (58.1 kg)   BMI 25.00 kg/m  CONSTITUTIONAL: Well-developed, well-nourished female in no acute distress.  HENT:  Normocephalic, atraumatic, External right and left ear normal.  EYES: Conjunctivae and EOM are normal. Pupils are equal, round, and reactive to light. No scleral icterus.  SKIN: Skin is warm and dry. No rash noted. Not diaphoretic. No erythema. No pallor. MUSCULOSKELETAL: Normal  range of motion. No tenderness.  No cyanosis, clubbing, or edema. NEUROLOGIC: Alert and oriented to person, place, and time. Normal reflexes, muscle tone coordination.  PSYCHIATRIC: Normal mood and affect. Normal behavior. Normal judgment and thought content. CARDIOVASCULAR: Normal heart rate noted, regular rhythm RESPIRATORY: Clear to auscultation bilaterally. Effort and breath sounds normal, no problems with respiration noted. BREASTS: Symmetric in size. No masses, tenderness, skin changes, nipple drainage, or lymphadenopathy bilaterally. Performed in the presence of a chaperone. ABDOMEN: Soft, no distention noted.  No tenderness, rebound or guarding.   Assessment and Plan:    1. Well woman exam without  gynecological exam - Pap not due - MM 3D SCREEN BREAST BILATERAL; Future  2. Perimenopausal vasomotor symptoms - Significantly improved with Effexor - Needs MD visit to discuss increasing dosage, other management strategies   Routine preventative health maintenance measures emphasized. Please refer to After Visit Summary for other counseling recommendations.      Clayton Bibles, MSA, MSN, CNM Certified Nurse Midwife, Biochemist, clinical for [employer], [facility]

## 2022-08-12 ENCOUNTER — Telehealth: Payer: Self-pay

## 2022-08-12 NOTE — Telephone Encounter (Signed)
Gastroenterology Pre-Procedure Review  Request Date: Schedule a office visit to discuss constipation  Requesting Physician: Dr. Allegra Lai  PATIENT REVIEW QUESTIONS: The patient responded to the following health history questions as indicated:    1. Are you having any GI issues? Yes constipation. Patient states she can go for a week with out a bowel movement.  2. Do you have a personal history of Polyps? no 3. Do you have a family history of Colon Cancer or Polyps? no 4. Diabetes Mellitus? no 5. Joint replacements in the past 12 months?no 6. Major health problems in the past 3 months?no 7. Any artificial heart valves, MVP, or defibrillator?no    MEDICATIONS & ALLERGIES:    Patient reports the following regarding taking any anticoagulation/antiplatelet therapy:   Plavix, Coumadin, Eliquis, Xarelto, Lovenox, Pradaxa, Brilinta, or Effient? no Aspirin? no  Patient confirms/reports the following medications:  Current Outpatient Medications  Medication Sig Dispense Refill   Ascorbic Acid (VITAMIN C) 100 MG tablet Take 100 mg by mouth daily.     cholecalciferol (VITAMIN D3) 25 MCG (1000 UNIT) tablet Take 1,000 Units by mouth daily.     pantoprazole (PROTONIX) 40 MG tablet Take 1 tablet (40 mg total) by mouth daily. 30 tablet 1   venlafaxine XR (EFFEXOR-XR) 150 MG 24 hr capsule Take 1 capsule (150 mg total) by mouth daily. 90 capsule 1   No current facility-administered medications for this visit.    Patient confirms/reports the following allergies:  No Known Allergies  No orders of the defined types were placed in this encounter.   AUTHORIZATION INFORMATION Primary Insurance: 1D#: Group #:  Secondary Insurance: 1D#: Group #:  SCHEDULE INFORMATION: Date:  Time: Location:

## 2022-09-17 ENCOUNTER — Ambulatory Visit: Payer: 59 | Admitting: Podiatry

## 2022-09-17 ENCOUNTER — Ambulatory Visit (INDEPENDENT_AMBULATORY_CARE_PROVIDER_SITE_OTHER): Payer: 59

## 2022-09-17 ENCOUNTER — Encounter: Payer: Self-pay | Admitting: Podiatry

## 2022-09-17 DIAGNOSIS — G5790 Unspecified mononeuropathy of unspecified lower limb: Secondary | ICD-10-CM | POA: Diagnosis not present

## 2022-09-17 DIAGNOSIS — M778 Other enthesopathies, not elsewhere classified: Secondary | ICD-10-CM | POA: Diagnosis not present

## 2022-09-17 MED ORDER — METHYLPREDNISOLONE 4 MG PO TBPK: ORAL_TABLET | ORAL | 0 refills | Status: DC

## 2022-09-17 MED ORDER — MELOXICAM 15 MG PO TABS: 15.0000 mg | ORAL_TABLET | Freq: Every day | ORAL | 3 refills | Status: AC

## 2022-09-17 NOTE — Progress Notes (Signed)
  Subjective:  Patient ID: Samantha Williams, female    DOB: 05-Mar-1976,  MRN: [id number] HPI Chief Complaint  Patient presents with   Foot Pain    Medial foot bilateral (R>L) - aching x few weeks, flat feet, can't be on feet long before they start to hurt, tried tylenol and pain reliever gel, epsom soaks   New Patient (Initial Visit)    47 y.o. female presents with the above complaint.   ROS: Denies fever chills nausea vomit muscle aches pains calf pain back pain chest pain shortness of breath.  Past Medical History:  Diagnosis Date   Anxiety    Arthritis    Depression    GERD (gastroesophageal reflux disease)    Past Surgical History:  Procedure Laterality Date   WISDOM TOOTH EXTRACTION      Current Outpatient Medications:    meloxicam (MOBIC) 15 MG tablet, Take 1 tablet (15 mg total) by mouth daily., Disp: 30 tablet, Rfl: 3   methylPREDNISolone (MEDROL DOSEPAK) 4 MG TBPK tablet, 6 day dose pack - take as directed, Disp: 21 tablet, Rfl: 0   Ascorbic Acid (VITAMIN C) 100 MG tablet, Take 100 mg by mouth daily., Disp: , Rfl:    cholecalciferol (VITAMIN D3) 25 MCG (1000 UNIT) tablet, Take 1,000 Units by mouth daily., Disp: , Rfl:    pantoprazole (PROTONIX) 40 MG tablet, Take 1 tablet (40 mg total) by mouth daily., Disp: 30 tablet, Rfl: 1   venlafaxine XR (EFFEXOR-XR) 150 MG 24 hr capsule, Take 1 capsule (150 mg total) by mouth daily., Disp: 90 capsule, Rfl: 1  No Known Allergies Review of Systems Objective:  There were no vitals filed for this visit.  General: Well developed, nourished, in no acute distress, alert and oriented x3   Dermatological: Skin is warm, dry and supple bilateral. Nails x 10 are well maintained; remaining integument appears unremarkable at this time. There are no open sores, no preulcerative lesions, no rash or signs of infection present.  Vascular: Dorsalis Pedis artery and Posterior Tibial artery pedal pulses are 2/4 bilateral with immedate capillary  fill time. Pedal hair growth present. No varicosities and no lower extremity edema present bilateral.   Neruologic: Grossly intact via light touch bilateral. Vibratory intact via tuning fork bilateral. Protective threshold with Semmes Wienstein monofilament intact to all pedal sites bilateral. Patellar and Achilles deep tendon reflexes 2+ bilateral. No Babinski or clonus noted bilateral.   Musculoskeletal: No gross boney pedal deformities bilateral. No pain, crepitus, or limitation noted with foot and ankle range of motion bilateral. Muscular strength 5/5 in all groups tested bilateral.  Mild producible pain on palpation of the first metatarsophalangeal joint and medial longitudinal arch  Gait: Unassisted, Nonantalgic.    Radiographs:  Radiographs taken today demonstrate osseously mature individual no significant osseous abnormalities.  Assessment & Plan:   Assessment: Cannot rule out arthropathy medial longitudinal arch and first metatarsophalangeal joint area.    Plan: Discussed etiology pathology conservative versus surgical therapies at this point point to start her on methylprednisolone to be followed by meloxicam.  We discussed appropriate shoe gear.  I am also going to do CBC CMP and arthritic profile.     Anilah Huck T. [place], [place]

## 2022-09-18 LAB — CBC WITH DIFFERENTIAL/PLATELET
Basophils Absolute: 0.1 10*3/uL (ref 0.0–0.2)
Basos: 1 %
EOS (ABSOLUTE): 0.2 10*3/uL (ref 0.0–0.4)
Eos: 2 %
Hematocrit: 39.4 % (ref 34.0–46.6)
Hemoglobin: 14.2 g/dL (ref 11.1–15.9)
Immature Grans (Abs): 0 10*3/uL (ref 0.0–0.1)
Immature Granulocytes: 0 %
Lymphocytes Absolute: 3 10*3/uL (ref 0.7–3.1)
Lymphs: 40 %
MCH: 33.8 pg — ABNORMAL HIGH (ref 26.6–33.0)
MCHC: 36 g/dL — ABNORMAL HIGH (ref 31.5–35.7)
MCV: 94 fL (ref 79–97)
Monocytes Absolute: 0.6 10*3/uL (ref 0.1–0.9)
Monocytes: 8 %
Neutrophils Absolute: 3.7 10*3/uL (ref 1.4–7.0)
Neutrophils: 49 %
Platelets: 299 10*3/uL (ref 150–450)
RBC: 4.2 x10E6/uL (ref 3.77–5.28)
RDW: 12 % (ref 11.7–15.4)
WBC: 7.6 10*3/uL (ref 3.4–10.8)

## 2022-09-18 LAB — COMPREHENSIVE METABOLIC PANEL
ALT: 25 IU/L (ref 0–32)
AST: 17 IU/L (ref 0–40)
Albumin/Globulin Ratio: 2.2 (ref 1.2–2.2)
Albumin: 4.4 g/dL (ref 3.9–4.9)
Alkaline Phosphatase: 133 IU/L — ABNORMAL HIGH (ref 44–121)
BUN/Creatinine Ratio: 19 (ref 9–23)
BUN: 17 mg/dL (ref 6–24)
Bilirubin Total: <0.2 mg/dL (ref 0.0–1.2)
CO2: 24 mmol/L (ref 20–29)
Calcium: 9.6 mg/dL (ref 8.7–10.2)
Chloride: 106 mmol/L (ref 96–106)
Creatinine, Ser: 0.89 mg/dL (ref 0.57–1.00)
Globulin, Total: 2 g/dL (ref 1.5–4.5)
Glucose: 93 mg/dL (ref 70–99)
Potassium: 4.3 mmol/L (ref 3.5–5.2)
Sodium: 143 mmol/L (ref 134–144)
Total Protein: 6.4 g/dL (ref 6.0–8.5)
eGFR: 81 mL/min/{1.73_m2} (ref >59)

## 2022-09-18 LAB — ANA: Anti Nuclear Antibody (ANA): NEGATIVE

## 2022-09-18 LAB — URIC ACID: Uric Acid: 3.5 mg/dL (ref 2.6–6.2)

## 2022-09-18 LAB — C-REACTIVE PROTEIN: CRP: 2 mg/L (ref 0–10)

## 2022-09-18 LAB — RHEUMATOID FACTOR: Rheumatoid fact SerPl-aCnc: <10 IU/mL (ref <14.0)

## 2022-09-18 LAB — SEDIMENTATION RATE: Sed Rate: 5 mm/hr (ref 0–32)

## 2022-09-22 NOTE — Progress Notes (Signed)
Patient has been updated, verbalized understanding. 

## 2022-10-03 ENCOUNTER — Ambulatory Visit: Payer: Self-pay | Admitting: Podiatry

## 2022-11-05 ENCOUNTER — Encounter: Payer: Self-pay | Admitting: Advanced Practice Midwife

## 2022-11-17 IMAGING — MG MM DIGITAL DIAGNOSTIC UNILAT*L* W/ TOMO W/ CAD
6 series · 6 of 18 positions shown · non-contrast
Comparison: Previous exam(s).

CLINICAL DATA: 44-year-old female presenting as a recall from
baseline screening for possible left breast asymmetry.

EXAM:
DIGITAL DIAGNOSTIC UNILATERAL LEFT MAMMOGRAM WITH TOMOSYNTHESIS AND
CAD
TECHNIQUE: Left digital diagnostic mammography and breast tomosynthesis was
performed. The images were evaluated with computer-aided detection.

[L MLO synth-2D]
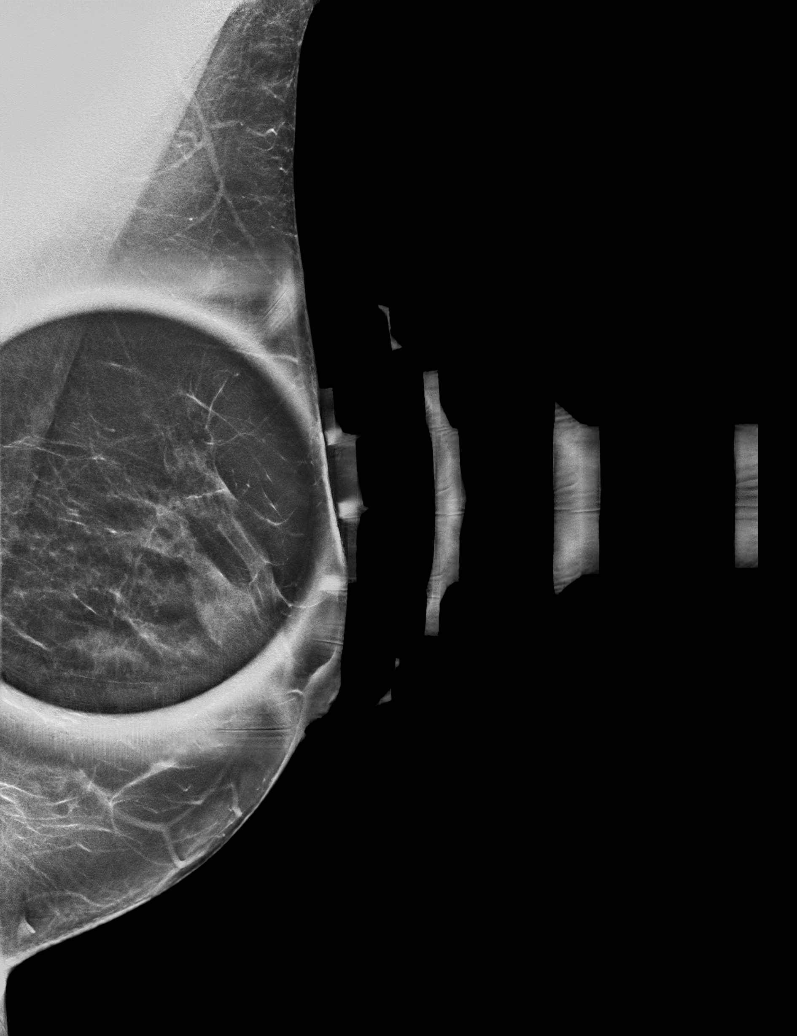

[L CC synth-2D (1 of 2)]
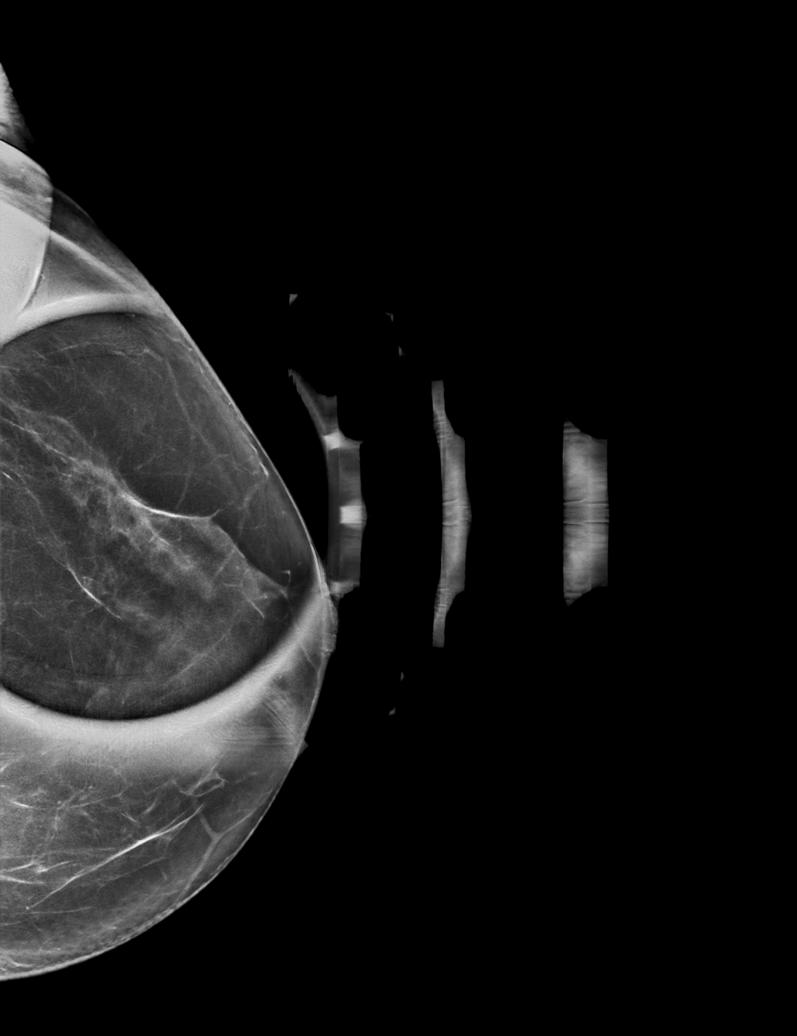

[L CC synth-2D (2 of 2)]
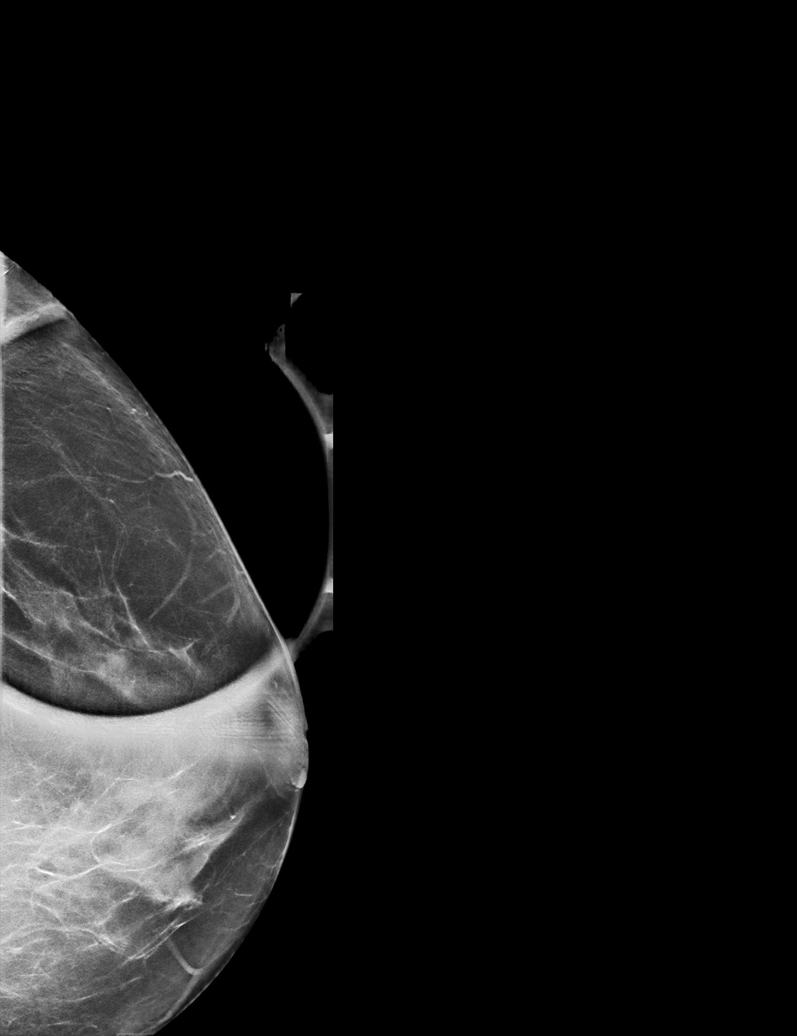

[L CC tomo (1 of 2) · tomo slice 29/58.0]
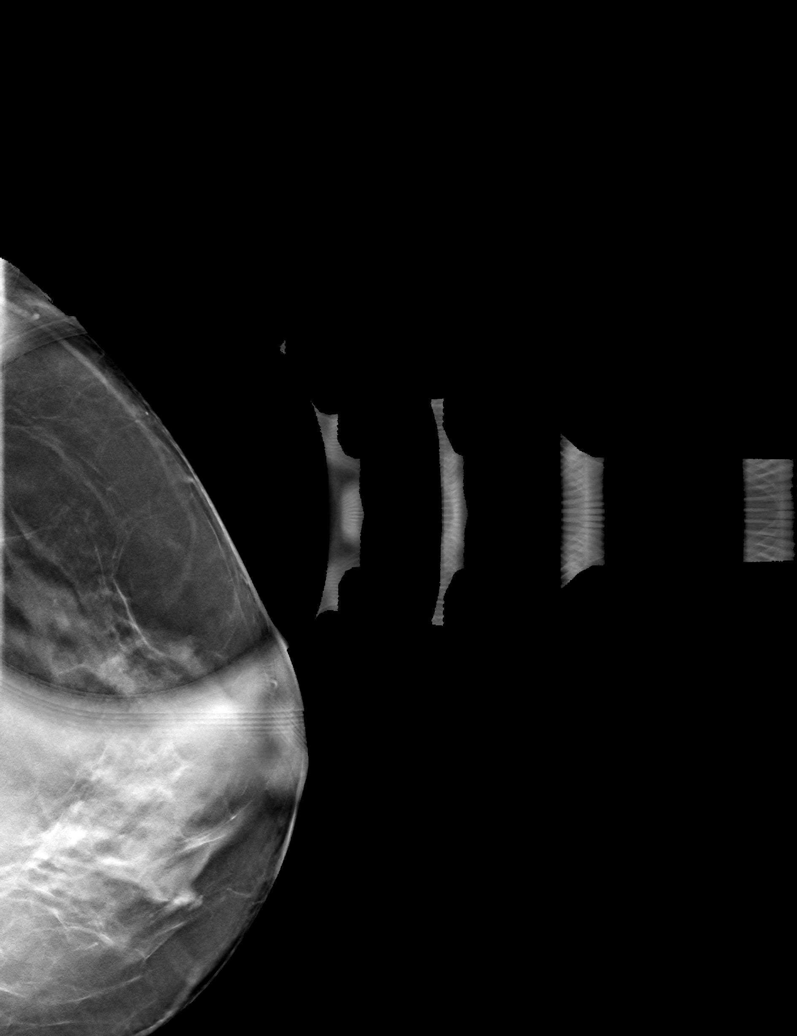

[L MLO tomo · tomo slice 27/54.0]
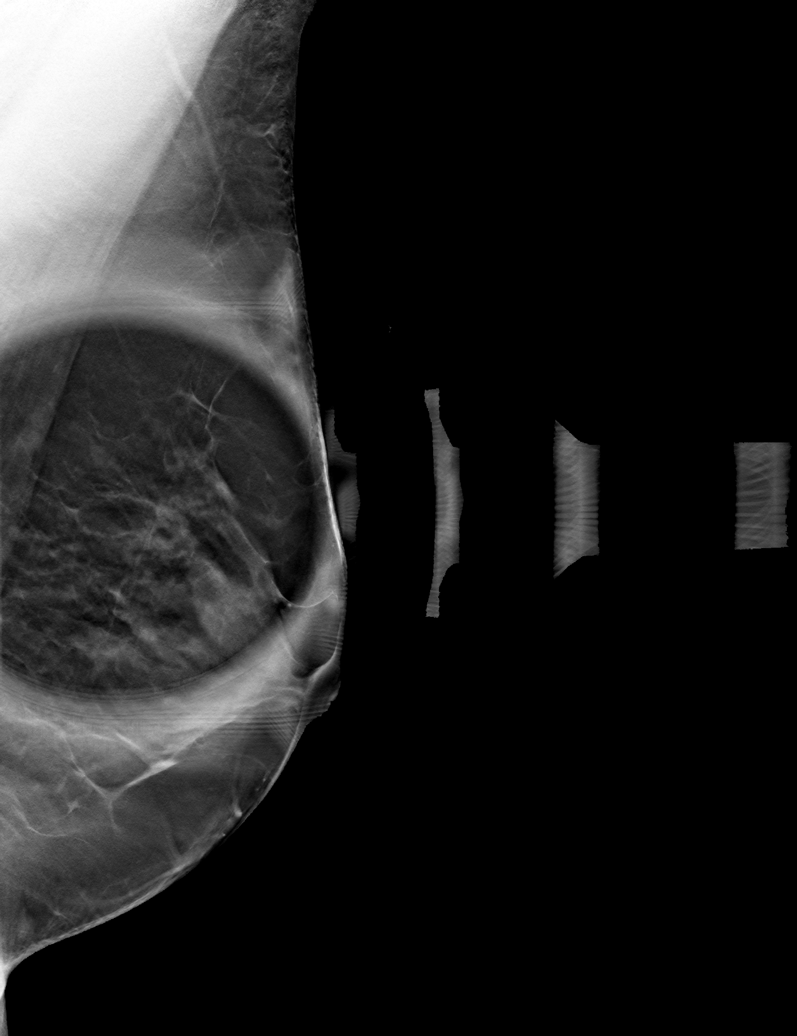

[L CC tomo (2 of 2) · tomo slice 32/63.0]
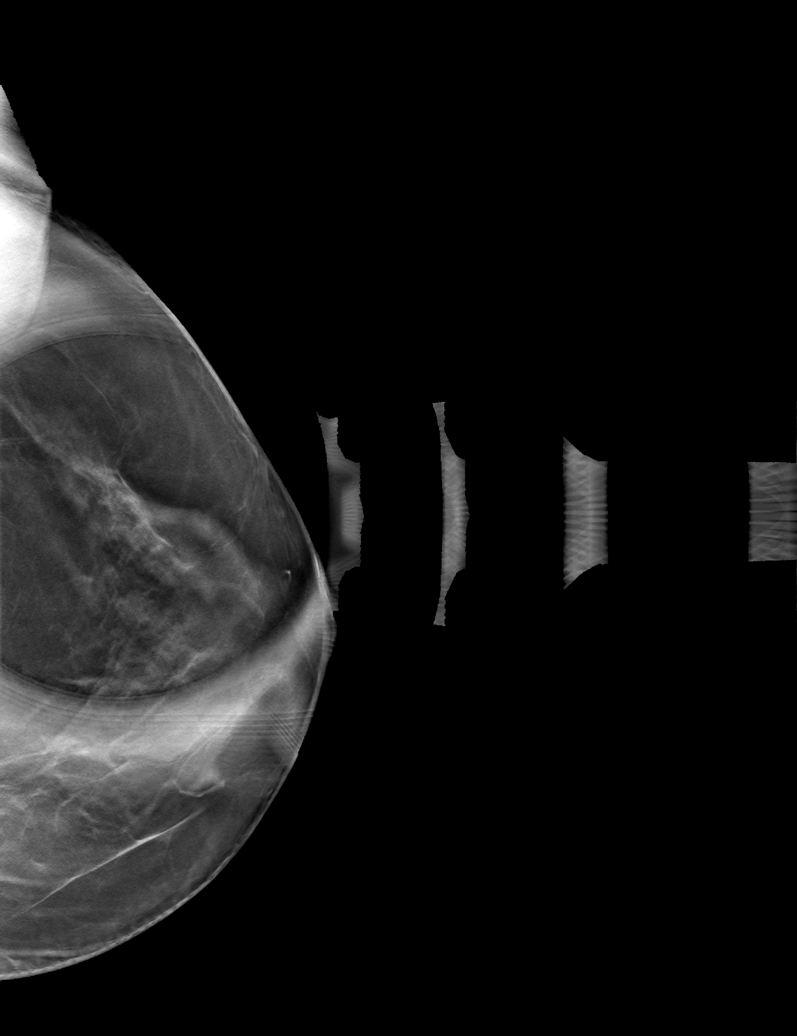

[6 of 18 positions shown; findings below may reference images not displayed]

ACR Breast Density Category c: The breast tissue is heterogeneously
dense, which may obscure small masses.
FINDINGS: Spot compression tomosynthesis views of the left breast performed
for a questioned asymmetry seen on screening mammogram in the upper
outer left breast. On the additional imaging the tissue in this area
disperses without persistent asymmetry, mass or true distortion. The
finding on screening mammograms consistent with normal overlapping
fibroglandular tissue. There are no additional new findings in the
left breast on today's imaging.
IMPRESSION: Resolution of the questioned asymmetry seen in the left breast on
screening mammogram.

RECOMMENDATION:
Screening mammogram in one year.(Code:XM-E-8RV)

I have discussed the findings and recommendations with the patient.
If applicable, a reminder letter will be sent to the patient
regarding the next appointment.

BI-RADS CATEGORY  1: Negative.

## 2022-11-19 ENCOUNTER — Other Ambulatory Visit: Payer: Self-pay

## 2022-11-19 ENCOUNTER — Encounter: Payer: Self-pay | Admitting: Gastroenterology

## 2022-11-19 ENCOUNTER — Ambulatory Visit: Payer: 59 | Admitting: Gastroenterology

## 2022-11-19 VITALS — BP 114/76 | HR 80 | Temp 98.1°F | Ht 60.0 in | Wt 131.1 lb

## 2022-11-19 DIAGNOSIS — Z1211 Encounter for screening for malignant neoplasm of colon: Secondary | ICD-10-CM

## 2022-11-19 DIAGNOSIS — K5909 Other constipation: Secondary | ICD-10-CM | POA: Diagnosis not present

## 2022-11-19 MED ORDER — NA SULFATE-K SULFATE-MG SULF 17.5-3.13-1.6 GM/177ML PO SOLN: 354.0000 mL | Freq: Once | ORAL | 0 refills | Status: AC

## 2022-11-19 NOTE — Patient Instructions (Signed)

## 2022-11-19 NOTE — Progress Notes (Signed)
Samantha Darby, MD [address]  [place]  [place], [place] [address]  Main: [phone]  Fax: [phone]  Gastroenterology Consultation  Referring Provider:     Mardene Speak, PA-C Primary Care Physician:  Patient, No Pcp Per Primary Gastroenterologist:  Dr. Cephas Williams Reason for Consultation: Constipation and discuss about colonoscopy        HPI:   Samantha Williams is a 47 y.o. female referred by Patient, No Pcp Per  for consultation & management of chronic constipation.  Patient reports patient she has been suffering from constipation for several years.  She has bowel movement about once a week on average.  Her diet is devoid of fiber.  She does drink carbonated beverages, states that she does not eat healthy at all, patient does not like to eat fruits or vegetables.  She takes over-the-counter laxatives as needed.  Her last bowel movement was 2 days ago associated with significant straining and small amount, hard stools.  Associated with abdominal bloating and abdominal cramps.  NSAIDs: None  Antiplts/Anticoagulants/Anti thrombotics: None  GI Procedures: None  Past Medical History:  Diagnosis Date   Anxiety    Arthritis    Depression    GERD (gastroesophageal reflux disease)     Past Surgical History:  Procedure Laterality Date   WISDOM TOOTH EXTRACTION       Current Outpatient Medications:    Ascorbic Acid (VITAMIN C) 100 MG tablet, Take 100 mg by mouth daily., Disp: , Rfl:    cholecalciferol (VITAMIN D3) 25 MCG (1000 UNIT) tablet, Take 1,000 Units by mouth daily., Disp: , Rfl:    meloxicam (MOBIC) 15 MG tablet, Take 1 tablet (15 mg total) by mouth daily., Disp: 30 tablet, Rfl: 3   methylPREDNISolone (MEDROL DOSEPAK) 4 MG TBPK tablet, 6 day dose pack - take as directed, Disp: 21 tablet, Rfl: 0   Na Sulfate-K Sulfate-Mg Sulf 17.5-3.13-1.6 GM/177ML SOLN, Take 354 mLs by mouth once for 1 dose., Disp: 354 mL, Rfl: 0   pantoprazole (PROTONIX) 40 MG  tablet, Take 1 tablet (40 mg total) by mouth daily., Disp: 30 tablet, Rfl: 1   venlafaxine XR (EFFEXOR-XR) 150 MG 24 hr capsule, Take 1 capsule (150 mg total) by mouth daily., Disp: 90 capsule, Rfl: 1   Family History  Problem Relation Age of Onset   Breast cancer Maternal Grandmother    Cancer Father        lung - smoker   Alcohol abuse Father    Arthritis Father    Alcohol abuse Mother    Depression Mother    COPD Brother    Drug abuse Brother    Early death Son    39 / [place] Daughter      Social History   Tobacco Use   Smoking status: Every Day    Packs/day: 1.00    Years: 15.00    Additional pack years: 0.00    Total pack years: 15.00    Types: Cigarettes   Smokeless tobacco: Never  Vaping Use   Vaping Use: Never used  Substance Use Topics   Alcohol use: No   Drug use: No    Allergies as of 11/19/2022   (No Known Allergies)    Review of Systems:    All systems reviewed and negative except where noted in HPI.   Physical Exam:  BP 114/76 (BP Location: Right Arm, Patient Position: Sitting, Cuff Size: Normal)   Pulse 80   Temp 98.1 F (36.7  C) (Oral)   Ht 5' (1.524 m)   Wt 131 lb 2 oz (59.5 kg)   BMI 25.61 kg/m  No LMP recorded. (Menstrual status: Perimenopausal).  General:   Alert,  Well-developed, well-nourished, pleasant and cooperative in NAD Head:  Normocephalic and atraumatic. Eyes:  Sclera clear, no icterus.   Conjunctiva pink. Ears:  Normal auditory acuity. Nose:  No deformity, discharge, or lesions. Mouth:  No deformity or lesions,oropharynx pink & moist. Neck:  Supple; no masses or thyromegaly. Lungs:  Respirations even and unlabored.  Clear throughout to auscultation.   No wheezes, crackles, or rhonchi. No acute distress. Heart:  Regular rate and rhythm; no murmurs, clicks, rubs, or gallops. Abdomen:  Normal bowel sounds. Soft, non-tender and distended, tympanic without masses, hepatosplenomegaly or hernias noted.  No guarding  or rebound tenderness.   Rectal: Not performed Msk:  Symmetrical without gross deformities. Good, equal movement & strength bilaterally. Pulses:  Normal pulses noted. Extremities:  No clubbing or edema.  No cyanosis. Neurologic:  Alert and oriented x3;  grossly normal neurologically. Skin:  Intact without significant lesions or rashes. No jaundice. Psych:  Alert and cooperative. Normal mood and affect.  Imaging Studies: Reviewed  Assessment and Plan:   Samantha Williams is a 47 y.o. female with history of anxiety, depression is seen in consultation for chronic constipation  Chronic constipation: Due to lack of fiber and adequate intake of water Reiterated on high-fiber diet and adequate intake of water, information provided Recommended Metamucil with large cup of water daily and MiraLAX with large cup of water daily  Colon cancer screening Recommend screening colonoscopy with 2-day prep and patient is agreeable  I have discussed alternative options, risks & benefits,  which include, but are not limited to, bleeding, infection, perforation,respiratory complication & drug reaction.  The patient agrees with this plan & written consent will be obtained.     Follow up as needed   Samantha Darby, MD

## 2022-12-03 ENCOUNTER — Encounter: Payer: Self-pay | Admitting: Gastroenterology

## 2022-12-09 ENCOUNTER — Encounter: Payer: Self-pay | Admitting: Gastroenterology

## 2022-12-10 ENCOUNTER — Ambulatory Visit: Payer: 59 | Admitting: Anesthesiology

## 2022-12-10 ENCOUNTER — Ambulatory Visit
Admission: RE | Admit: 2022-12-10 | Discharge: 2022-12-10 | Disposition: A | Discharge status: Home / Self Care | Payer: 59 | Attending: Gastroenterology | Admitting: Gastroenterology

## 2022-12-10 ENCOUNTER — Encounter: Payer: Self-pay | Admitting: Gastroenterology

## 2022-12-10 ENCOUNTER — Encounter
Admission: RE | Disposition: A | Discharge status: Home / Self Care | Payer: Self-pay | Source: Home / Self Care | Attending: Gastroenterology

## 2022-12-10 DIAGNOSIS — F419 Anxiety disorder, unspecified: Secondary | ICD-10-CM | POA: Diagnosis not present

## 2022-12-10 DIAGNOSIS — F32A Depression, unspecified: Secondary | ICD-10-CM | POA: Insufficient documentation

## 2022-12-10 DIAGNOSIS — D124 Benign neoplasm of descending colon: Secondary | ICD-10-CM | POA: Diagnosis not present

## 2022-12-10 DIAGNOSIS — Z1211 Encounter for screening for malignant neoplasm of colon: Secondary | ICD-10-CM

## 2022-12-10 DIAGNOSIS — K635 Polyp of colon: Secondary | ICD-10-CM | POA: Diagnosis not present

## 2022-12-10 DIAGNOSIS — F17210 Nicotine dependence, cigarettes, uncomplicated: Secondary | ICD-10-CM | POA: Insufficient documentation

## 2022-12-10 DIAGNOSIS — K219 Gastro-esophageal reflux disease without esophagitis: Secondary | ICD-10-CM | POA: Insufficient documentation

## 2022-12-10 DIAGNOSIS — D125 Benign neoplasm of sigmoid colon: Secondary | ICD-10-CM | POA: Diagnosis not present

## 2022-12-10 HISTORY — PX: COLONOSCOPY WITH PROPOFOL: SHX5780

## 2022-12-10 LAB — POCT PREGNANCY, URINE: Preg Test, Ur: NEGATIVE

## 2022-12-10 SURGERY — COLONOSCOPY WITH PROPOFOL
Anesthesia: General

## 2022-12-10 MED ORDER — PROPOFOL 500 MG/50ML IV EMUL
INTRAVENOUS | Status: DC | PRN
  Administered 2022-12-10: 100 ug/kg/min via INTRAVENOUS

## 2022-12-10 MED ORDER — LIDOCAINE HCL (PF) 2 % IJ SOLN
INTRAMUSCULAR | Status: AC
  Filled 2022-12-10: qty 5

## 2022-12-10 MED ORDER — PROPOFOL 1000 MG/100ML IV EMUL
INTRAVENOUS | Status: AC
  Filled 2022-12-10: qty 100

## 2022-12-10 MED ORDER — PROPOFOL 10 MG/ML IV BOLUS
INTRAVENOUS | Status: DC | PRN
  Administered 2022-12-10: 80 mg via INTRAVENOUS
  Administered 2022-12-10: 20 mg via INTRAVENOUS

## 2022-12-10 MED ORDER — SODIUM CHLORIDE 0.9 % IV SOLN: INTRAVENOUS | Status: DC

## 2022-12-10 MED ORDER — LIDOCAINE HCL (CARDIAC) PF 100 MG/5ML IV SOSY
PREFILLED_SYRINGE | INTRAVENOUS | Status: DC | PRN
  Administered 2022-12-10: 50 mg via INTRAVENOUS

## 2022-12-10 NOTE — Transfer of Care (Signed)
Immediate Anesthesia Transfer of Care Note  Patient: Samantha Williams  Procedure(s) Performed: COLONOSCOPY WITH PROPOFOL  Patient Location: Endoscopy Unit  Anesthesia Type:General  Level of Consciousness: unresponsive  Airway & Oxygen Therapy: Patient Spontanous Breathing  Post-op Assessment: Report given to RN and Post -op Vital signs reviewed and stable  Post vital signs: Reviewed and stable  Last Vitals:  Vitals Value Taken Time  BP    Temp    Pulse 77 12/10/22 0815  Resp 14 12/10/22 0815  SpO2 100 % 12/10/22 0815  Vitals shown include unvalidated device data.  Last Pain:  Vitals:   12/10/22 0716  TempSrc: Temporal  PainSc: 0-No pain         Complications: No notable events documented.

## 2022-12-10 NOTE — H&P (Signed)
Arlyss Repress, MD [address]  [address]  [place], [place] [address]  Main: [phone]  Fax: [phone] Pager: [phone]  Primary Care Physician:  Patient, No Pcp Per Primary Gastroenterologist:  Dr. Arlyss Repress  Pre-Procedure History & Physical: HPI:  Samantha Williams is a 47 y.o. female is here for an colonoscopy.   Past Medical History:  Diagnosis Date   Anxiety    Arthritis    Depression    GERD (gastroesophageal reflux disease)     Past Surgical History:  Procedure Laterality Date   WISDOM TOOTH EXTRACTION      Prior to Admission medications   Medication Sig Start Date End Date Taking? Authorizing Provider  Ascorbic Acid (VITAMIN C) 100 MG tablet Take 100 mg by mouth daily.   Yes [provider]  cholecalciferol (VITAMIN D3) 25 MCG (1000 UNIT) tablet Take 1,000 Units by mouth daily.   Yes [provider]  venlafaxine XR (EFFEXOR-XR) 150 MG 24 hr capsule Take 1 capsule (150 mg total) by mouth daily. 07/11/22  Yes Reva Bores, MD  meloxicam (MOBIC) 15 MG tablet Take 1 tablet (15 mg total) by mouth daily. Patient not taking: Reported on 12/10/2022 09/17/22   Hyatt, Max T, DPM  methylPREDNISolone (MEDROL DOSEPAK) 4 MG TBPK tablet 6 day dose pack - take as directed 09/17/22   Hyatt, Max T, DPM  pantoprazole (PROTONIX) 40 MG tablet Take 1 tablet (40 mg total) by mouth daily. Patient not taking: Reported on 12/10/2022 02/16/17 11/19/22  Minna Antis, MD    Allergies as of 11/19/2022   (No Known Allergies)    Family History  Problem Relation Age of Onset   Breast cancer Maternal Grandmother    Cancer Father        lung - smoker   Alcohol abuse Father    Arthritis Father    Alcohol abuse Mother    Depression Mother    COPD Brother    Drug abuse Brother    Early death Son    Miscarriages / [place] Daughter     Social History   Socioeconomic History   Marital status: Married    Spouse name: Not on file   Number of  children: Not on file   Years of education: Not on file   Highest education level: Not on file  Occupational History   Not on file  Tobacco Use   Smoking status: Every Day    Packs/day: 1.00    Years: 15.00    Additional pack years: 0.00    Total pack years: 15.00    Types: Cigarettes   Smokeless tobacco: Never  Vaping Use   Vaping Use: Never used  Substance and Sexual Activity   Alcohol use: No   Drug use: No   Sexual activity: Yes    Birth control/protection: Post-menopausal  Other Topics Concern   Not on file  Social History Narrative   Not on file   Social Determinants of Health   Financial Resource Strain: Not on file  Food Insecurity: Not on file  Transportation Needs: Not on file  Physical Activity: Not on file  Stress: Not on file  Social Connections: Not on file  Intimate Partner Violence: Not on file    Review of Systems: See HPI, otherwise negative ROS  Physical Exam: BP 114/88   Pulse 100   Temp (!) 96.4 F (35.8 C) (Temporal)   Resp 17   Ht 5' (1.524 m)   Wt 57.7 kg  SpO2 100%   BMI 24.84 kg/m  General:   Alert,  pleasant and cooperative in NAD Head:  Normocephalic and atraumatic. Neck:  Supple; no masses or thyromegaly. Lungs:  Clear throughout to auscultation.    Heart:  Regular rate and rhythm. Abdomen:  Soft, nontender and nondistended. Normal bowel sounds, without guarding, and without rebound.   Neurologic:  Alert and  oriented x4;  grossly normal neurologically.  Impression/Plan: [name] is here for an colonoscopy to be performed for colon cancer screening  Risks, benefits, limitations, and alternatives regarding  colonoscopy have been reviewed with the patient.  Questions have been answered.  All parties agreeable.   Lannette Donath, MD  12/10/2022, 7:46 AM

## 2022-12-10 NOTE — Anesthesia Postprocedure Evaluation (Signed)
Anesthesia Post Note  Patient: Samantha Williams  Procedure(s) Performed: COLONOSCOPY WITH PROPOFOL  Patient location during evaluation: PACU Anesthesia Type: General Level of consciousness: awake and awake and alert Pain management: pain level controlled Vital Signs Assessment: post-procedure vital signs reviewed and stable Respiratory status: spontaneous breathing and respiratory function stable Cardiovascular status: blood pressure returned to baseline Anesthetic complications: no   No notable events documented.   Last Vitals:  Vitals:   12/10/22 0824 12/10/22 0834  BP: 110/69 98/85  Pulse: 76 81  Resp: 15 15  Temp:    SpO2: 100% 100%    Last Pain:  Vitals:   12/10/22 0834  TempSrc:   PainSc: 0-No pain                 VAN STAVEREN,Denard Tuminello

## 2022-12-10 NOTE — Anesthesia Preprocedure Evaluation (Signed)
Anesthesia Evaluation  Patient identified by MRN, date of birth, ID band Patient awake    Reviewed: Allergy & Precautions, NPO status , Patient's Chart, lab work & pertinent test results  Airway Mallampati: II  TM Distance: >3 FB Neck ROM: Full    Dental  (+) Teeth Intact   Pulmonary neg pulmonary ROS, Current Smoker and Patient abstained from smoking.   Pulmonary exam normal breath sounds clear to auscultation       Cardiovascular Exercise Tolerance: Good negative cardio ROS Normal cardiovascular exam Rhythm:Regular Rate:Normal     Neuro/Psych   Anxiety     negative neurological ROS  negative psych ROS   GI/Hepatic negative GI ROS, Neg liver ROS,GERD  Medicated,,  Endo/Other  negative endocrine ROS    Renal/GU negative Renal ROS  negative genitourinary   Musculoskeletal  (+) Arthritis ,    Abdominal Normal abdominal exam  (+)   Peds negative pediatric ROS (+)  Hematology negative hematology ROS (+)   Anesthesia Other Findings Past Medical History: No date: Anxiety No date: Arthritis No date: Depression No date: GERD (gastroesophageal reflux disease)  Past Surgical History: No date: WISDOM TOOTH EXTRACTION  BMI    Body Mass Index: 24.84 kg/m      Reproductive/Obstetrics negative OB ROS                             Anesthesia Physical Anesthesia Plan  ASA: 2  Anesthesia Plan: General   Post-op Pain Management:    Induction: Intravenous  PONV Risk Score and Plan: Propofol infusion and TIVA  Airway Management Planned: Natural Airway  Additional Equipment:   Intra-op Plan:   Post-operative Plan:   Informed Consent: I have reviewed the patients History and Physical, chart, labs and discussed the procedure including the risks, benefits and alternatives for the proposed anesthesia with the patient or authorized representative who has indicated his/her understanding and  acceptance.     Dental Advisory Given  Plan Discussed with: CRNA and Surgeon  Anesthesia Plan Comments:        Anesthesia Quick Evaluation

## 2022-12-10 NOTE — Op Note (Signed)
[facility]enter[facility] Patient Name: Samantha Williams Procedure Date: 12/10/2022 7:16 AM MRN: [id number] Account #: [id number] Date of Birth: 10/04/1975 Admit Type: Outpatient Age: 47 Room: [facility] ENDO ROOM 4 Gender: Female Note Status: Finalized Instrument Name: Prentice Docker [id number] Procedure:             Colonoscopy Indications:           Screening for colorectal malignant neoplasm, This is                         the patient's first colonoscopy Providers:             Toney Reil MD, MD Referring MD:          No Local Md, MD (Referring MD) Medicines:             General Anesthesia Complications:         No immediate complications. Estimated blood loss: None. Procedure:             Pre-Anesthesia Assessment:                        - Prior to the procedure, a History and Physical was                         performed, and patient medications and allergies were                         reviewed. The patient is competent. The risks and                         benefits of the procedure and the sedation options and                         risks were discussed with the patient. All questions                         were answered and informed consent was obtained.                         Patient identification and proposed procedure were                         verified by the physician, the nurse, the                         anesthesiologist, the anesthetist and the technician                         in the pre-procedure area in the procedure room in the                         endoscopy suite. Mental Status Examination: alert and                         oriented. Airway Examination: normal oropharyngeal                         airway and neck mobility. Respiratory Examination:  clear to auscultation. CV Examination: normal.                         Prophylactic Antibiotics: The patient does not require                         prophylactic  antibiotics. Prior Anticoagulants: The                         patient has taken no anticoagulant or antiplatelet                         agents. ASA Grade Assessment: II - A patient with mild                         systemic disease. After reviewing the risks and                         benefits, the patient was deemed in satisfactory                         condition to undergo the procedure. The anesthesia                         plan was to use general anesthesia. Immediately prior                         to administration of medications, the patient was                         re-assessed for adequacy to receive sedatives. The                         heart rate, respiratory rate, oxygen saturations,                         blood pressure, adequacy of pulmonary ventilation, and                         response to care were monitored throughout the                         procedure. The physical status of the patient was                         re-assessed after the procedure.                        After obtaining informed consent, the colonoscope was                         passed under direct vision. Throughout the procedure,                         the patient's blood pressure, pulse, and oxygen                         saturations were monitored continuously. The  Colonoscope was introduced through the anus and                         advanced to the the cecum, identified by appendiceal                         orifice and ileocecal valve. The colonoscopy was                         performed without difficulty. The patient tolerated                         the procedure well. The quality of the bowel                         preparation was evaluated using the BBPS [facility] Bowel                         Preparation Scale) with scores of: Right Colon = 2                         (minor amount of residual staining, small fragments of                         stool  and/or opaque liquid, but mucosa seen well),                         Transverse Colon = 2 (minor amount of residual                         staining, small fragments of stool and/or opaque                         liquid, but mucosa seen well) and Left Colon = 2                         (minor amount of residual staining, small fragments of                         stool and/or opaque liquid, but mucosa seen well). The                         total BBPS score equals 6. The ileocecal valve,                         appendiceal orifice, and rectum were photographed. Findings:      The perianal and digital rectal examinations were normal. Pertinent       negatives include normal sphincter tone and no palpable rectal lesions.      Two sessile polyps were found in the descending colon. The polyps were       diminutive in size. These polyps were removed with a jumbo cold forceps.       Resection and retrieval were complete. Estimated blood loss: none.      Four sessile polyps were found in the recto-sigmoid colon and sigmoid       colon. The polyps were 5 to 6 mm in size. These  polyps were removed with       a cold snare. Resection and retrieval were complete. Estimated blood       loss: none.      The retroflexed view of the distal rectum and anal verge was normal and       showed no anal or rectal abnormalities.      A moderate amount of liquid stool was found in the entire colon. Lavage       of the area was performed using 50 - 200 mL of sterile water, resulting       in clearance with fair visualization. Impression:            - Two diminutive polyps in the descending colon,                         removed with a jumbo cold forceps. Resected and                         retrieved.                        - Four 5 to 6 mm polyps at the recto-sigmoid colon and                         in the sigmoid colon, removed with a cold snare.                         Resected and retrieved.                         - The distal rectum and anal verge are normal on                         retroflexion view.                        - Stool in the entire examined colon. Recommendation:        - Discharge patient to home (with escort).                        - Resume previous diet today.                        - Continue present medications.                        - Await pathology results.                        - Repeat colonoscopy in 3 years for surveillance with                         2 day prep. Procedure Code(s):     --- Professional ---                        [phone], Colonoscopy, flexible; with removal of                         tumor(s), polyp(s), or other lesion(s) by snare  technique                        [id number], 59, Colonoscopy, flexible; with biopsy, single                         or multiple Diagnosis Code(s):     --- Professional ---                        Z12.11, Encounter for screening for malignant neoplasm                         of colon                        D12.4, Benign neoplasm of descending colon                        D12.7, Benign neoplasm of rectosigmoid junction                        D12.5, Benign neoplasm of sigmoid colon CPT copyright 2022 American Medical Association. All rights reserved. The codes documented in this report are preliminary and upon coder review may  be revised to meet current compliance requirements. Dr. Libby Maw Toney Reil MD, MD 12/10/2022 8:14:51 AM This report has been signed electronically. Number of Addenda: 0 Note Initiated On: 12/10/2022 7:16 AM Scope Withdrawal Time: 0 hours 15 minutes 27 seconds  Total Procedure Duration: 0 hours 17 minutes 43 seconds  Estimated Blood Loss:  Estimated blood loss: none.      [facility]

## 2022-12-11 ENCOUNTER — Encounter: Payer: Self-pay | Admitting: Gastroenterology

## 2022-12-11 LAB — SURGICAL PATHOLOGY

## 2023-01-02 DIAGNOSIS — G5632 Lesion of radial nerve, left upper limb: Secondary | ICD-10-CM | POA: Diagnosis not present

## 2023-01-04 ENCOUNTER — Other Ambulatory Visit: Payer: Self-pay | Admitting: Family Medicine

## 2023-01-04 DIAGNOSIS — Z780 Asymptomatic menopausal state: Secondary | ICD-10-CM

## 2023-01-15 ENCOUNTER — Encounter: Payer: Self-pay | Admitting: Family Medicine

## 2023-01-15 DIAGNOSIS — Z780 Asymptomatic menopausal state: Secondary | ICD-10-CM

## 2023-01-16 MED ORDER — VENLAFAXINE HCL ER 150 MG PO CP24: 150.0000 mg | ORAL_CAPSULE | Freq: Every day | ORAL | 1 refills | Status: DC

## 2023-02-02 ENCOUNTER — Other Ambulatory Visit: Payer: Self-pay | Admitting: *Deleted

## 2023-02-02 DIAGNOSIS — Z780 Asymptomatic menopausal state: Secondary | ICD-10-CM

## 2023-02-02 MED ORDER — VENLAFAXINE HCL ER 150 MG PO CP24
150.0000 mg | ORAL_CAPSULE | Freq: Every day | ORAL | 1 refills | Status: DC
Start: 2023-02-02 — End: 2023-08-11

## 2023-02-02 NOTE — Progress Notes (Signed)
There was an error when refilled on 5/24, will resend now.

## 2023-02-05 DIAGNOSIS — R202 Paresthesia of skin: Secondary | ICD-10-CM | POA: Diagnosis not present

## 2023-04-22 ENCOUNTER — Encounter: Payer: Self-pay | Admitting: Family Medicine

## 2023-04-23 MED ORDER — HYDROXYZINE HCL 10 MG PO TABS: 10.0000 mg | ORAL_TABLET | Freq: Three times a day (TID) | ORAL | 0 refills | Status: DC | PRN

## 2023-06-10 NOTE — Progress Notes (Unsigned)
Complete physical exam  Patient: Samantha Williams   DOB: 07/16/1976   47 y.o. Female  MRN: [id number] Visit Date: 06/11/2023  Today's healthcare provider: Debera Lat, PA-C   No chief complaint on file.  Subjective    Samantha Williams is a 48 y.o. female who presents today for a complete physical exam.  She reports consuming a {diet types:17450} diet. {Exercise:19826} She generally feels {well/fairly well/poorly:18703}. She reports sleeping {well/fairly well/poorly:18703}. She {does/does not:200015} have additional problems to discuss today.  HPI  *** Discussed the use of AI scribe software for clinical note transcription with the patient, who gave verbal consent to proceed.  History of Present Illness            Last depression screening scores    07/29/2022    1:42 PM  PHQ 2/9 Scores  PHQ - 2 Score 0  PHQ- 9 Score 0   Last fall risk screening    07/29/2022    1:42 PM  Fall Risk   Falls in the past year? 0  Number falls in past yr: 0  Injury with Fall? 0  Risk for fall due to : No Fall Risks  Follow up Falls evaluation completed   Last Audit-C alcohol use screening    07/29/2022    1:42 PM  Alcohol Use Disorder Test (AUDIT)  1. How often do you have a drink containing alcohol? 0  2. How many drinks containing alcohol do you have on a typical day when you are drinking? 0  3. How often do you have six or more drinks on one occasion? 0  AUDIT-C Score 0   A score of 3 or more in women, and 4 or more in men indicates increased risk for alcohol abuse, EXCEPT if all of the points are from question 1   Past Medical History:  Diagnosis Date   Anxiety    Arthritis    Depression    GERD (gastroesophageal reflux disease)    Past Surgical History:  Procedure Laterality Date   COLONOSCOPY WITH PROPOFOL N/A 12/10/2022   Procedure: COLONOSCOPY WITH PROPOFOL;  Surgeon: Toney Reil, MD;  Location: [facility] ENDOSCOPY;  Service: Gastroenterology;  Laterality:  N/A;   WISDOM TOOTH EXTRACTION     Social History   Socioeconomic History   Marital status: Married    Spouse name: Not on file   Number of children: Not on file   Years of education: Not on file   Highest education level: Not on file  Occupational History   Not on file  Tobacco Use   Smoking status: Every Day    Current packs/day: 1.00    Average packs/day: 1 pack/day for 15.0 years (15.0 ttl pk-yrs)    Types: Cigarettes   Smokeless tobacco: Never  Vaping Use   Vaping status: Never Used  Substance and Sexual Activity   Alcohol use: No   Drug use: No   Sexual activity: Yes    Birth control/protection: Post-menopausal  Other Topics Concern   Not on file  Social History Narrative   Not on file   Social Determinants of Health   Financial Resource Strain: Not on file  Food Insecurity: Not on file  Transportation Needs: Not on file  Physical Activity: Not on file  Stress: Not on file  Social Connections: Not on file  Intimate Partner Violence: Not on file   Family Status  Relation Name Status   MGM  (Not Specified)   Father Ray (  Not Specified)   Mother Darel Hong (Not Specified)   Brother Chanetta Marshall (Not Specified)   Son Apolinar Junes (Not Specified)   Daughter Miscarriage (Not Specified)  No partnership data on file   Family History  Problem Relation Age of Onset   Breast cancer Maternal Grandmother    Cancer Father        lung - smoker   Alcohol abuse Father    Arthritis Father    Alcohol abuse Mother    Depression Mother    COPD Brother    Drug abuse Brother    Early death Son    Miscarriages / [place] Daughter    No Known Allergies  Patient Care Team: Patient, No Pcp Per as PCP - General (General Practice)   Medications: Outpatient Medications Prior to Visit  Medication Sig   Ascorbic Acid (VITAMIN C) 100 MG tablet Take 100 mg by mouth daily.   cholecalciferol (VITAMIN D3) 25 MCG (1000 UNIT) tablet Take 1,000 Units by mouth daily.   hydrOXYzine (ATARAX) 10  MG tablet Take 1 tablet (10 mg total) by mouth 3 (three) times daily as needed.   meloxicam (MOBIC) 15 MG tablet Take 1 tablet (15 mg total) by mouth daily. (Patient not taking: Reported on 12/10/2022)   methylPREDNISolone (MEDROL DOSEPAK) 4 MG TBPK tablet 6 day dose pack - take as directed   pantoprazole (PROTONIX) 40 MG tablet Take 1 tablet (40 mg total) by mouth daily. (Patient not taking: Reported on 12/10/2022)   venlafaxine XR (EFFEXOR-XR) 150 MG 24 hr capsule Take 1 capsule (150 mg total) by mouth daily.   No facility-administered medications prior to visit.    Review of Systems  All other systems reviewed and are negative.  Except see HPI  {Insert previous labs (optional):23779} {See past labs  Heme  Chem  Endocrine  Serology  Results Review (optional):1}  Objective    LMP 02/02/2017  {Insert last BP/Wt (optional):23777}{See vitals history (optional):1}    Physical Exam Vitals reviewed.  Constitutional:      General: She is not in acute distress.    Appearance: Normal appearance. She is well-developed. She is not diaphoretic.  HENT:     Head: Normocephalic and atraumatic.  Eyes:     General: No scleral icterus.    Conjunctiva/sclera: Conjunctivae normal.  Neck:     Thyroid: No thyromegaly.  Cardiovascular:     Rate and Rhythm: Normal rate and regular rhythm.     Pulses: Normal pulses.     Heart sounds: Normal heart sounds. No murmur heard. Pulmonary:     Effort: Pulmonary effort is normal. No respiratory distress.     Breath sounds: Normal breath sounds. No wheezing, rhonchi or rales.  Musculoskeletal:     Cervical back: Neck supple.     Right lower leg: No edema.     Left lower leg: No edema.  Lymphadenopathy:     Cervical: No cervical adenopathy.  Skin:    General: Skin is warm and dry.     Findings: No rash.  Neurological:     Mental Status: She is alert and oriented to person, place, and time. Mental status is at baseline.  Psychiatric:        Mood  and Affect: Mood normal.        Behavior: Behavior normal.      No results found for any visits on 06/11/23.  Assessment & Plan    Routine Health Maintenance and Physical Exam  Exercise Activities and Dietary recommendations  Goals  None     Immunization History  Administered Date(s) Administered   DTaP 04/21/1977, 11/05/1977, 03/04/1978, 04/15/1982   MMR 09/12/1978   OPV 04/21/1977, 11/05/1977, 03/04/1978, 04/15/1982   Td 01/23/1998   Tdap 10/25/2010, 08/25/2016    Health Maintenance  Topic Date Due   Flu Shot  Never done   COVID-19 Vaccine (1 - 2023-24 season) Never done   Pap with HPV screening  09/26/2025   Colon Cancer Screening  12/09/2025   DTaP/Tdap/Td vaccine (8 - Td or Tdap) 08/25/2026   Hepatitis C Screening  Completed   HIV Screening  Completed   HPV Vaccine  Aged Out    Discussed health benefits of physical activity, and encouraged her to engage in regular exercise appropriate for her age and condition.  Assessment and Plan              ***  No follow-ups on file.    The patient was advised to call back or seek an in-person evaluation if the symptoms worsen or if the condition fails to improve as anticipated.  I discussed the assessment and treatment plan with the patient. The patient was provided an opportunity to ask questions and all were answered. The patient agreed with the plan and demonstrated an understanding of the instructions.  I, Debera Lat, PA-C have reviewed all documentation for this visit. The documentation on  06/11/23  for the exam, diagnosis, procedures, and orders are all accurate and complete.  Debera Lat, [facility], MMS [facility] [phone] (phone) [phone] (fax)  [facility]

## 2023-06-11 ENCOUNTER — Ambulatory Visit (INDEPENDENT_AMBULATORY_CARE_PROVIDER_SITE_OTHER): Payer: 59 | Admitting: Physician Assistant

## 2023-06-11 VITALS — BP 106/79 | HR 75 | Temp 97.9°F | Ht 60.0 in | Wt 132.0 lb

## 2023-06-11 DIAGNOSIS — Z0000 Encounter for general adult medical examination without abnormal findings: Secondary | ICD-10-CM | POA: Diagnosis not present

## 2023-08-04 ENCOUNTER — Other Ambulatory Visit: Payer: Self-pay | Admitting: Family Medicine

## 2023-08-04 DIAGNOSIS — Z780 Asymptomatic menopausal state: Secondary | ICD-10-CM

## 2023-10-08 ENCOUNTER — Encounter: Payer: Self-pay | Admitting: Family Medicine

## 2023-11-09 ENCOUNTER — Other Ambulatory Visit: Payer: Self-pay | Admitting: Family Medicine

## 2023-11-09 DIAGNOSIS — Z780 Asymptomatic menopausal state: Secondary | ICD-10-CM

## 2023-11-09 MED ORDER — VENLAFAXINE HCL ER 150 MG PO CP24: 150.0000 mg | ORAL_CAPSULE | Freq: Every day | ORAL | 0 refills | Status: DC

## 2023-11-11 ENCOUNTER — Encounter: Payer: Self-pay | Admitting: Family Medicine

## 2023-11-11 ENCOUNTER — Ambulatory Visit (INDEPENDENT_AMBULATORY_CARE_PROVIDER_SITE_OTHER): Payer: Self-pay | Admitting: Family Medicine

## 2023-11-11 VITALS — BP 143/87 | HR 96 | Wt 132.0 lb

## 2023-11-11 DIAGNOSIS — Z780 Asymptomatic menopausal state: Secondary | ICD-10-CM

## 2023-11-11 DIAGNOSIS — F419 Anxiety disorder, unspecified: Secondary | ICD-10-CM

## 2023-11-11 MED ORDER — CALCIUM CARBONATE 1500 (600 CA) MG PO TABS
1500.0000 mg | ORAL_TABLET | Freq: Two times a day (BID) | ORAL | 3 refills | Status: AC
Start: 2023-11-11 — End: ?

## 2023-11-11 MED ORDER — HYDROXYZINE HCL 10 MG PO TABS
10.0000 mg | ORAL_TABLET | Freq: Three times a day (TID) | ORAL | 0 refills | Status: AC | PRN
Start: 2023-11-11 — End: ?

## 2023-11-11 NOTE — Progress Notes (Signed)
 CC: discuss issues  Menopausal issues  Requesting blood work   Wants to push off pap for today

## 2023-11-11 NOTE — Progress Notes (Signed)
   Subjective:    Patient ID: Samantha Williams is a 48 y.o. female presenting with Follow-up  on 11/11/2023  HPI: LMP was in her late 72s. Has FH of premature menopause. Mostly having mood swings. She ran out of her Effexor and has not had it for the last few days. Has seen some mild improvement with her Effexor though still having hot flashes and worse are night sweats. A lot of GI problems. Also reports joint problems. Declines pap due to no insurance. She is at 3 years, but not 5 and had NILM pap with neg HPV in 2022.   Review of Systems  Constitutional:  Negative for chills and fever.  Respiratory:  Negative for shortness of breath.   Cardiovascular:  Negative for chest pain.  Gastrointestinal:  Negative for abdominal pain, nausea and vomiting.  Genitourinary:  Negative for dysuria.  Skin:  Negative for rash.      Objective:    BP (!) 143/87   Pulse 96   Wt 132 lb (59.9 kg)   LMP 02/02/2017   BMI 25.78 kg/m  Physical Exam Exam conducted with a chaperone present.  Constitutional:      General: She is not in acute distress.    Appearance: She is well-developed.  HENT:     Head: Normocephalic and atraumatic.  Eyes:     General: No scleral icterus. Cardiovascular:     Rate and Rhythm: Normal rate.  Pulmonary:     Effort: Pulmonary effort is normal.  Abdominal:     Palpations: Abdomen is soft.  Musculoskeletal:     Cervical back: Neck supple.  Skin:    General: Skin is warm and dry.  Neurological:     Mental Status: She is alert and oriented to person, place, and time.         Assessment & Plan:   Problem List Items Addressed This Visit       Unprioritized   Menopause - Primary   Not a candidate due to smoking for HRT and length of time. Risks of MI, CVA, DVT/PE reviewed. Continue Effexor. At risk of osteoporosis. Consider DEXA when gets insurance. Begin calcium + vitamin D. Reviewed lifestyle choices, regular exercise, dressing in layers, avoiding  spicy foods, hot liquids, turning down thermostat, consider smoking cessation.      Relevant Medications   calcium carbonate (OSCAL) 1500 (600 Ca) MG TABS tablet   Other Visit Diagnoses       Anxiety       Relevant Medications   hydrOXYzine (ATARAX) 10 MG tablet        Return if symptoms worsen or fail to improve.  Reva Bores, MD 11/11/2023 8:40 AM

## 2023-11-11 NOTE — Assessment & Plan Note (Addendum)
 Not a candidate due to smoking for HRT and length of time. Risks of MI, CVA, DVT/PE reviewed. Continue Effexor. At risk of osteoporosis. Consider DEXA when gets insurance. Begin calcium + vitamin D. Reviewed lifestyle choices, regular exercise, dressing in layers, avoiding spicy foods, hot liquids, turning down thermostat, consider smoking cessation.

## 2023-11-25 ENCOUNTER — Other Ambulatory Visit (HOSPITAL_COMMUNITY)
Admission: RE | Admit: 2023-11-25 | Discharge: 2023-11-25 | Disposition: A | Discharge status: Home / Self Care | Payer: Self-pay | Source: Ambulatory Visit | Attending: Family Medicine | Admitting: Family Medicine

## 2023-11-25 ENCOUNTER — Encounter: Payer: Self-pay | Admitting: Family Medicine

## 2023-11-25 ENCOUNTER — Ambulatory Visit (INDEPENDENT_AMBULATORY_CARE_PROVIDER_SITE_OTHER): Payer: Self-pay | Admitting: Family Medicine

## 2023-11-25 VITALS — BP 128/82 | HR 89

## 2023-11-25 DIAGNOSIS — Z124 Encounter for screening for malignant neoplasm of cervix: Secondary | ICD-10-CM | POA: Insufficient documentation

## 2023-11-25 NOTE — Progress Notes (Signed)
 Subjective:     Samantha Williams is a 48 y.o. female and is here for a comprehensive physical exam. The patient reports no problems.  Review of Systems Pertinent items noted in HPI and remainder of comprehensive ROS otherwise negative.   Objective:    BP 128/82   Pulse 89   LMP 02/02/2017  General appearance: alert, cooperative, and appears stated age Head: Normocephalic, without obvious abnormality, atraumatic Lungs:  normal effort Heart: regular rate and rhythm Abdomen: soft, non-tender; bowel sounds normal; no masses,  no organomegaly Pelvic: cervix normal in appearance, external genitalia normal, no adnexal masses or tenderness, no cervical motion tenderness, uterus normal size, shape, and consistency, and vagina normal without discharge Extremities: extremities normal, atraumatic, no cyanosis or edema Skin: Skin color, texture, turgor normal. No rashes or lesions Neurologic: Grossly normal    Assessment:    GYN female exam.      Plan:  Screening for malignant neoplasm of cervix - Plan: Cytology - PAP    See After Visit Summary for Counseling Recommendations

## 2023-11-25 NOTE — Progress Notes (Signed)
 RGYN here for pap.  Annual w/ PCP was 06/11/23.  Last pap 2022.

## 2023-11-26 ENCOUNTER — Encounter: Payer: Self-pay | Admitting: Family Medicine

## 2023-11-26 LAB — CYTOLOGY - PAP
Comment: NEGATIVE
Diagnosis: NEGATIVE
High risk HPV: NEGATIVE

## 2023-12-14 ENCOUNTER — Ambulatory Visit: Payer: 59 | Admitting: Gastroenterology

## 2024-03-10 ENCOUNTER — Other Ambulatory Visit: Payer: Self-pay | Admitting: Family Medicine

## 2024-03-10 DIAGNOSIS — Z780 Asymptomatic menopausal state: Secondary | ICD-10-CM

## 2024-03-25 ENCOUNTER — Encounter: Payer: Self-pay | Admitting: Family Medicine

## 2024-03-25 DIAGNOSIS — Z780 Asymptomatic menopausal state: Secondary | ICD-10-CM

## 2024-03-25 MED ORDER — VENLAFAXINE HCL ER 150 MG PO CP24
150.0000 mg | ORAL_CAPSULE | Freq: Every day | ORAL | 3 refills | Status: AC
Start: 2024-03-25 — End: ?

## 2024-06-13 ENCOUNTER — Encounter: Payer: Self-pay | Admitting: Physician Assistant
